# Patient Record
Sex: Female | Born: 1975 | Race: Black or African American | Hispanic: No | Marital: Single | Smoking: Never smoker
Health system: Southern US, Community
[De-identification: ages and names within clinical notes are randomized; demographics above are authoritative.]

## PROBLEM LIST (undated history)

## (undated) DIAGNOSIS — D649 Anemia, unspecified: Secondary | ICD-10-CM

## (undated) HISTORY — PX: INTRAUTERINE DEVICE (IUD) INSERTION: SHX5877

## (undated) HISTORY — DX: Anemia, unspecified: D64.9

---

## 2009-09-06 ENCOUNTER — Ambulatory Visit (HOSPITAL_COMMUNITY): Admission: RE | Admit: 2009-09-06 | Discharge: 2009-09-06 | Payer: Self-pay | Admitting: *Deleted

## 2009-09-28 ENCOUNTER — Ambulatory Visit (HOSPITAL_COMMUNITY): Admission: RE | Admit: 2009-09-28 | Discharge: 2009-09-28 | Payer: Self-pay | Admitting: Family Medicine

## 2009-10-16 ENCOUNTER — Ambulatory Visit (HOSPITAL_COMMUNITY): Admission: RE | Admit: 2009-10-16 | Discharge: 2009-10-16 | Payer: Self-pay | Admitting: Family Medicine

## 2009-12-24 ENCOUNTER — Ambulatory Visit (HOSPITAL_COMMUNITY): Admission: RE | Admit: 2009-12-24 | Discharge: 2009-12-24 | Payer: Self-pay | Admitting: Obstetrics & Gynecology

## 2010-02-11 ENCOUNTER — Ambulatory Visit (HOSPITAL_COMMUNITY): Admission: RE | Admit: 2010-02-11 | Discharge: 2010-02-11 | Payer: Self-pay | Admitting: Family Medicine

## 2010-03-05 ENCOUNTER — Ambulatory Visit (HOSPITAL_COMMUNITY): Admission: RE | Admit: 2010-03-05 | Discharge: 2010-03-05 | Payer: Self-pay | Admitting: Family Medicine

## 2010-03-14 ENCOUNTER — Ambulatory Visit: Payer: Self-pay | Admitting: Obstetrics & Gynecology

## 2010-03-14 ENCOUNTER — Inpatient Hospital Stay (HOSPITAL_COMMUNITY): Admission: AD | Admit: 2010-03-14 | Discharge: 2010-03-19 | Payer: Self-pay | Admitting: Obstetrics & Gynecology

## 2010-03-16 ENCOUNTER — Encounter: Payer: Self-pay | Admitting: Obstetrics & Gynecology

## 2010-03-16 ENCOUNTER — Other Ambulatory Visit: Payer: Self-pay | Admitting: Family Medicine

## 2010-10-13 ENCOUNTER — Encounter: Payer: Self-pay | Admitting: *Deleted

## 2010-12-08 LAB — CBC
Hemoglobin: 11.1 g/dL — ABNORMAL LOW (ref 12.0–15.0)
MCH: 30.2 pg (ref 26.0–34.0)
MCHC: 34.5 g/dL (ref 30.0–36.0)
MCV: 86.5 fL (ref 78.0–100.0)
MCV: 87.2 fL (ref 78.0–100.0)
Platelets: 134 10*3/uL — ABNORMAL LOW (ref 150–400)
Platelets: 222 10*3/uL (ref 150–400)
RBC: 2.85 MIL/uL — ABNORMAL LOW (ref 3.87–5.11)
RDW: 15 % (ref 11.5–15.5)
RDW: 15.2 % (ref 11.5–15.5)
WBC: 12.8 10*3/uL — ABNORMAL HIGH (ref 4.0–10.5)

## 2010-12-08 LAB — CROSSMATCH

## 2010-12-08 LAB — GLUCOSE, CAPILLARY: Glucose-Capillary: 108 mg/dL — ABNORMAL HIGH (ref 70–99)

## 2010-12-08 LAB — RPR: RPR Ser Ql: NONREACTIVE

## 2010-12-08 LAB — ABO/RH: ABO/RH(D): O POS

## 2013-07-28 ENCOUNTER — Other Ambulatory Visit: Payer: Self-pay

## 2013-11-02 ENCOUNTER — Ambulatory Visit (INDEPENDENT_AMBULATORY_CARE_PROVIDER_SITE_OTHER): Payer: No Typology Code available for payment source | Admitting: Nurse Practitioner

## 2013-11-02 ENCOUNTER — Encounter: Payer: Self-pay | Admitting: Nurse Practitioner

## 2013-11-02 VITALS — BP 100/66 | HR 68 | Ht 60.25 in | Wt 166.0 lb

## 2013-11-02 DIAGNOSIS — Z113 Encounter for screening for infections with a predominantly sexual mode of transmission: Secondary | ICD-10-CM

## 2013-11-02 DIAGNOSIS — Z0000 Encounter for general adult medical examination without abnormal findings: Secondary | ICD-10-CM

## 2013-11-02 DIAGNOSIS — Z975 Presence of (intrauterine) contraceptive device: Secondary | ICD-10-CM

## 2013-11-02 DIAGNOSIS — Z01419 Encounter for gynecological examination (general) (routine) without abnormal findings: Secondary | ICD-10-CM

## 2013-11-02 LAB — HEMOGLOBIN, FINGERSTICK: Hemoglobin, fingerstick: 13 g/dL (ref 12.0–16.0)

## 2013-11-02 LAB — POCT URINALYSIS DIPSTICK
Bilirubin, UA: NEGATIVE
Glucose, UA: NEGATIVE
Ketones, UA: NEGATIVE
Leukocytes, UA: NEGATIVE
NITRITE UA: NEGATIVE
PROTEIN UA: NEGATIVE
RBC UA: NEGATIVE
UROBILINOGEN UA: NEGATIVE
pH, UA: 6

## 2013-11-02 NOTE — Progress Notes (Signed)
Patient ID: Sabrina Holmes, female   DOB: 09-18-1976, 38 y.o.   MRN: [id number] 38 y.o. [id number] Single African American Fe here for annual exam.  Menses are regular at 28 days.  Lasting 5 days. Moderate to light.  Condoms all the time..  Needs to restart on some type of birth control.   The time last pill was taken over 10 years. One episode of Depo after daughter was born and never returned for follow up.   Same partner X 1 year. Wants STD checked.  Patient's last menstrual period was 10/16/2013.          Sexually active: yes  The current method of family planning is condoms all of the time.    Exercising: yes  Gym/ health club routine includes cardio. Smoker:  no  Health Maintenance: Pap:  04/2010, no history of abnormal TDaP:  ? Labs: HB:  13.0 Urine:  Negative, pH 6.0   reports that she has never smoked. She has never used smokeless tobacco. She reports that she does not drink alcohol or use illicit drugs.  History reviewed. No pertinent past medical history.  Past Surgical History  Procedure Laterality Date  . Cesarean section  2011    x 1    No current outpatient prescriptions on file.   No current facility-administered medications for this visit.    Family History  Problem Relation Age of Onset  . Hypertension Mother   . Diabetes Mother   . Diabetes Father   . Hypertension Father   . Asthma Brother     ROS:  Pertinent items are noted in HPI.  Otherwise, a comprehensive ROS was negative.  Exam:   BP 100/66  Pulse 68  Ht 5' 0.25" (1.53 m)  Wt 166 lb (75.297 kg)  BMI 32.17 kg/m2  LMP 10/16/2013 Height: 5' 0.25" (153 cm)  Ht Readings from Last 3 Encounters:  11/02/13 5' 0.25" (1.53 m)    General appearance: alert, cooperative and appears stated age Head: Normocephalic, without obvious abnormality, atraumatic Neck: no adenopathy, supple, symmetrical, trachea midline and thyroid normal to inspection and palpation Lungs: clear to auscultation bilaterally Breasts:  normal appearance, no masses or tenderness Heart: regular rate and rhythm Abdomen: soft, non-tender; no masses,  no organomegaly Extremities: extremities normal, atraumatic, no cyanosis or edema Skin: Skin color, texture, turgor normal. No rashes or lesions Lymph nodes: Cervical, supraclavicular, and axillary nodes normal. No abnormal inguinal nodes palpated Neurologic: Grossly normal   Pelvic: External genitalia:  no lesions              Urethra:  normal appearing urethra with no masses, tenderness or lesions              Bartholin's and Skene's: normal                 Vagina: normal appearing vagina with normal color and discharge, no lesions              Cervix: anteverted              Pap taken: yes Bimanual Exam:  Uterus:  normal size, contour, position, consistency, mobility, non-tender              Adnexa: no mass, fullness, tenderness               Rectovaginal: Confirms               Anus:  normal sphincter tone, no lesions  A:  Well Woman  with normal exam  Over 35 with birth control needs - (history of 6 TAB)  Usually non compliant to OCP  R/O STD's    P:   Pap smear as per guidelines Done today  Counseled with regards to birth control choices and options given her non compliance issues.  Suggested IUD,  Implanon or Depo Provera.  She is considering Mirena IUD as the best option.   Info on Mirena IUD and will schedule if insurance covers  Counseled on breast self exam, STD prevention, use and side effects of OCP's, family planning choices, adequate intake of calcium and vitamin D, diet and exercise return annually or prn  An After Visit Summary was printed and given to the patient.

## 2013-11-02 NOTE — Patient Instructions (Signed)

## 2013-11-03 ENCOUNTER — Telehealth: Payer: Self-pay | Admitting: Obstetrics and Gynecology

## 2013-11-03 LAB — STD PANEL
HIV: NONREACTIVE
Hepatitis B Surface Ag: NEGATIVE

## 2013-11-03 LAB — IPS N GONORRHOEA AND CHLAMYDIA BY PCR

## 2013-11-03 NOTE — Telephone Encounter (Signed)
lmtcb

## 2013-11-03 NOTE — Telephone Encounter (Signed)
Patient returned call. I advised of 0 patient liability quoted for iud insertion. Patient to call with cycle

## 2013-11-04 LAB — IPS PAP TEST WITH HPV

## 2013-11-04 NOTE — Progress Notes (Signed)
Encounter reviewed by Dr. Waver Dibiasio Silva.  

## 2013-11-09 ENCOUNTER — Telehealth: Payer: Self-pay | Admitting: Nurse Practitioner

## 2013-11-09 NOTE — Telephone Encounter (Signed)
Thank you. I will be happy to see the patient. I will close the encounter.

## 2013-11-09 NOTE — Telephone Encounter (Signed)
Pt started cycle on yesterday and would like to schedule an appointment for Friday is possible for Mirena insertion.

## 2013-11-09 NOTE — Telephone Encounter (Signed)
Patient started cycle 2/17. Mirena insert requested. Precert completed. Appointment scheduled with Dr. Edward JollySilva for IUD insert for 11/11/13. [id number].  Pre procedure instructions given.  Motrin instructions given. Motrin=Advil=Ibuprofen, 800 mg one hour before appointment. Eat a meal and hydrate well before appointment.  [id number].  Routing to provider for final review. Patient agreeable to disposition. Will close encounter

## 2013-11-11 ENCOUNTER — Ambulatory Visit: Payer: No Typology Code available for payment source | Admitting: Obstetrics and Gynecology

## 2013-11-11 ENCOUNTER — Telehealth: Payer: Self-pay | Admitting: Obstetrics and Gynecology

## 2013-11-11 NOTE — Telephone Encounter (Signed)
Thank you for the update.  Encounter closed. 

## 2013-11-11 NOTE — Telephone Encounter (Signed)
Spoke with patient. She had to cancel today due to car troubles.  Started cycle on 11/08/13.  She is unable to come for an appointment on Monday. She will call for an appointment on the first day of her cycle for next month. Advised to ensure she calls on first day and if starts on a weekend to call on Monday. Patient is agreeable.  Routing to provider for final review. Patient agreeable to disposition. Will close encounter

## 2013-11-11 NOTE — Telephone Encounter (Signed)
Patient had to cancel iud insertion today due to having car problems. Needs to reschedule.

## 2013-12-08 ENCOUNTER — Telehealth: Payer: Self-pay | Admitting: Nurse Practitioner

## 2013-12-08 NOTE — Telephone Encounter (Signed)
Spoke with patient. Started menses 3/19 and would like to schedule IUD placement. Wednesday appointment offered but patient declined requesting a Monday appointment due to schedule. Appointment scheduled for Monday the 3/23 at 1:30 with Dr. Farrel GobbleLathrop. Patient agreeable and states understanding. Pre procedure instructions given.  Motrin instructions given. Motrin=Advil=Ibuprofen, 800 mg one hour before appointment. Eat a meal and hydrate well before appointment. Will call back with any further questions.  Routing to provider for final review. Patient agreeable to disposition. Will close encounter

## 2013-12-08 NOTE — Telephone Encounter (Signed)
Patient calling to schedule IUD insertion. Started cycle today Patient is at lunch til 11:45 if no call before then it will be after 3:30 before she can talk since she is at work.

## 2013-12-12 ENCOUNTER — Encounter: Payer: Self-pay | Admitting: Gynecology

## 2013-12-12 ENCOUNTER — Ambulatory Visit (INDEPENDENT_AMBULATORY_CARE_PROVIDER_SITE_OTHER): Payer: No Typology Code available for payment source | Admitting: Gynecology

## 2013-12-12 VITALS — BP 116/62 | HR 68 | Resp 18 | Ht 60.25 in | Wt 176.0 lb

## 2013-12-12 DIAGNOSIS — Z975 Presence of (intrauterine) contraceptive device: Secondary | ICD-10-CM

## 2013-12-12 NOTE — Patient Instructions (Signed)
IUD PLACEMENT POST PROCEDURE INSTRUCTIONS  1. You may take Ibuprofen, Aleve or Tylenol for pain as needed.      Cramping should resolve within 24 hours.  2. You may have a small amount of spotting. You should wear a mini pad for the next few days  3. You may have intercourse after 24 hours. If you are using this for birth control, it is effective immediately.  4. You need to call if you have any pelvic pain, fever, heavy bleeding or foul smelling vaginal discharge. Irregular bleeding is common the first several months after having an IUD placed. You do not need to for this reason unless you are                     concerned.   5. Shower or bathe as normal  6. You should have a follow-up appointment in 4-8 weeks for a re-check to make sure you are not having any problems.  

## 2013-12-12 NOTE — Progress Notes (Signed)
89 yrsSingle African American female presents for  insertion of Mirena. Denies any vaginal symptoms or STD concerns.    LMP: 12/08/13.  Patient read information regarding IUD insertion.  All questions addressed.    Healthy female,time, place and personnormal menses, no abnormal bleeding, pelvic pain or discharge Abdomen: soft, non-tender Groin inguinal nodes palpated  Pelvic exam: Vulva;normal female genitalia  Vagina:normal vagina, no discharge, exudate, lesion, or erythema  Cervix:Non-tender, Negative CMT, no lesions or redness, abnormal appearance  Uterus:normal shape, position and consistency   Lab:no pathogens   Pt counseled regarding risks and benefits of IUD placement including bleeding profile, infection and uterine perforation.  Pt has had negative STD screen and is monogamous.    Procedure:  Bimanual exam performed.  Speculum inserted into vagina. Cervix visualized and cleansed with betadine solution X 3. Tenaculum placed on cervix at 6 o'clock position(s).  Uterus sounded to 8 centimeters.  IUD removed from sterile packet and under sterile conditions inserted to fundus of uterus.  Introducer removed without difficulty.  IUD string trimmed to 3 centimeters.  Remainder string given to patient to feel for identification.  Tenaculum removed.  Some bleeding noted.  Speculum removed.  Uterus palpated normal.  Patient tolerated procedure well.  A: Insertion of Mirena, Lot # [id number], Expiration date 8/17   P:  Instructions and warnings signs given.       IUD identification card given with IUD removal 11/2018       Return visit 42M

## 2014-01-02 ENCOUNTER — Telehealth: Payer: Self-pay | Admitting: Nurse Practitioner

## 2014-01-02 NOTE — Telephone Encounter (Signed)
Message left to return call to Darrien Laakso at [phone].    

## 2014-01-02 NOTE — Telephone Encounter (Signed)
Pt is having some itching and odor.

## 2014-01-02 NOTE — Telephone Encounter (Addendum)
Spoke with patient. She is having new vaginal itching and odor that patient feels needs to be evaluated by Dr. Farrel GobbleLathrop as she has recently had a mirena iud placed. Denies fevers. Patient requests an office visit for afternoons after 2:30. Okay to use 01/03/14 at 1630?

## 2014-01-03 ENCOUNTER — Ambulatory Visit: Payer: No Typology Code available for payment source | Admitting: Gynecology

## 2014-01-03 NOTE — Telephone Encounter (Signed)
Pt came in for her appointment but did not have her co pay spoke with Shanda BumpsJessica and Kennon RoundsSally and was told to reschedule her. Pt is scheduled for 01/06/14 @ 4:00pm w/DL.

## 2014-01-06 ENCOUNTER — Encounter: Payer: Self-pay | Admitting: Certified Nurse Midwife

## 2014-01-06 ENCOUNTER — Ambulatory Visit (INDEPENDENT_AMBULATORY_CARE_PROVIDER_SITE_OTHER): Payer: No Typology Code available for payment source | Admitting: Certified Nurse Midwife

## 2014-01-06 VITALS — BP 90/60 | HR 72 | Resp 16 | Ht 60.25 in | Wt 177.0 lb

## 2014-01-06 DIAGNOSIS — N760 Acute vaginitis: Secondary | ICD-10-CM

## 2014-01-06 MED ORDER — METRONIDAZOLE 500 MG PO TABS: 500.0000 mg | ORAL_TABLET | Freq: Two times a day (BID) | ORAL | Status: DC

## 2014-01-06 NOTE — Patient Instructions (Signed)

## 2014-01-06 NOTE — Progress Notes (Signed)
38 y.o.Single African American female [phone] with a 2 week(s) history of the following:discharge described as malodorous, dark and old blood color and odor Sexually active: yes Last sexual activity:12days ago. No STD concerns or need for screening. Pt also reports the following associated symptoms: none Patient has not tried over the counter treatment. Denies new personal products or UTI symptoms.  O:Healthy female WDWN Affect:; normal, orientation x 3 Abdomen: soft, non tender    Exam:  HYQ:MVHQIOExt:normal, Bartholin's, Urethra, Skene's normal                NGE:XBMWUXLKGVag:discharge: copious, brown and malodorous, scant blood, pH 5.5, wet prep done, extra flap of vaginal tissue noted in vagina(patient relates has had for years)                Cx:  normal appearance, IUD string visualized and non tender, negative CMT                Uterus:normal size, non-tender, normal shape and consistency                Adnexa: normal adnexa and no mass, fullness, tenderness not present  Wet Prep shows:clue cells   MW:NUUVOZDGUx:bacterial vaginosis  P:Reviewed findings of BV and also normal cervical appearance with IUD noted in cervical os. Questions addressed. Rx Flagyl see order Warning signs with IUD reviewed and need to advise if present. Symptomatic local care discussed. Transport plannerducational materials distributed. Keep follow up visit for IUD with Dr. Farrel GobbleLathrop  RV prn

## 2014-01-13 NOTE — Progress Notes (Signed)
Reviewed personally.  M. Suzanne Othon Guardia, MD.  

## 2014-01-16 ENCOUNTER — Encounter: Payer: Self-pay | Admitting: Gynecology

## 2014-01-16 ENCOUNTER — Ambulatory Visit (INDEPENDENT_AMBULATORY_CARE_PROVIDER_SITE_OTHER): Payer: No Typology Code available for payment source | Admitting: Gynecology

## 2014-01-16 VITALS — BP 100/66 | HR 68 | Ht 60.25 in | Wt 178.0 lb

## 2014-01-16 DIAGNOSIS — Z30431 Encounter for routine checking of intrauterine contraceptive device: Secondary | ICD-10-CM

## 2014-01-16 NOTE — Progress Notes (Signed)
Subjective:     Patient ID: Sabrina Holmes, female   DOB: September 02, 1976, 38 y.o.   MRN: [id number]  HPI Comments: Pt here for 7969m check up after IUD placement.  Pt states that she is bleeding daily, noticed mostly when she wipes.  Pt reports cycle was unchanged.  Pt denies pain with coitus since placement.  Pt was seen recently for vaginitis, feeling better    Review of Systems  Constitutional: Negative for fever, chills and fatigue.  Genitourinary: Positive for vaginal bleeding. Negative for vaginal discharge and pelvic pain.       Objective:   Physical Exam  Nursing note and vitals reviewed. Constitutional: She appears well-developed and well-nourished.   Pelvic: External genitalia:  no lesions              Urethra:  normal appearing urethra with no masses, tenderness or lesions              Bartholins and Skenes: normal                 Vagina: normal appearing vagina with normal color and discharge, no lesions              Cervix: IUD string noted                     Bimanual Exam:  Uterus:  uterus is normal size, shape, consistency and nontender                                      Adnexa: normal adnexa in size, nontender and no masses                                         Assessment:     IUD check up     Plan:     Doing well Bleeding profile reviewed, pt assured.  Bleeding seems to be lightening.

## 2014-01-18 ENCOUNTER — Ambulatory Visit: Payer: No Typology Code available for payment source | Admitting: Gynecology

## 2014-04-03 ENCOUNTER — Telehealth: Payer: Self-pay | Admitting: Nurse Practitioner

## 2014-04-03 DIAGNOSIS — Z309 Encounter for contraceptive management, unspecified: Secondary | ICD-10-CM

## 2014-04-03 NOTE — Telephone Encounter (Signed)
IUD inserted 12-12-13 with Dr Farrel GobbleLathrop. Really bothered by persistent vaginal brown spotting and odor. States odor is almost "foul" and isnt getting worse but wont resolve.Requests appointment for 04-14-14 or later after 230 pm. Advised I would recommend she be seen sooner if complaining of foul odor. States she does not get pain until 04-14-14. States she does not use tampons and denies pain or fever and it is not increasing. OV scheduled for 04-19-14 at 330 (first available late afternoon after 04-14-14. Instructed to call back or go to urgent care if gets worse before appointment and she is agreeable.  Routing to provider for final review. Patient agreeable to disposition. Will close encounter  Sabrina, please precert IUD removal and notify patient.

## 2014-04-03 NOTE — Telephone Encounter (Signed)
Patient is not happy with her IUD and would like to have it removed.

## 2014-04-06 NOTE — Telephone Encounter (Signed)
Left message for patient to call back. Need to notify that IUD removal is covered at 100% of allowable

## 2014-04-19 ENCOUNTER — Encounter: Payer: Self-pay | Admitting: Gynecology

## 2014-04-19 ENCOUNTER — Ambulatory Visit (INDEPENDENT_AMBULATORY_CARE_PROVIDER_SITE_OTHER): Payer: No Typology Code available for payment source | Admitting: Gynecology

## 2014-04-19 VITALS — BP 112/60 | Resp 18 | Ht 60.25 in | Wt 177.0 lb

## 2014-04-19 DIAGNOSIS — Z30432 Encounter for removal of intrauterine contraceptive device: Secondary | ICD-10-CM

## 2014-04-19 DIAGNOSIS — N898 Other specified noninflammatory disorders of vagina: Secondary | ICD-10-CM

## 2014-04-19 LAB — POCT WET PREP (WET MOUNT): Clue Cells Wet Prep Whiff POC: POSITIVE

## 2014-04-19 MED ORDER — TINIDAZOLE 500 MG PO TABS: 2.0000 g | ORAL_TABLET | Freq: Once | ORAL | Status: DC

## 2014-04-19 NOTE — Progress Notes (Signed)
Pt here for IUD removal.  Pt is displeased with vaginal odor.  She was diagnosed with BV in 4/15 shortly after placement but says that the odor returned some time later, worse with menses.  Pt states that she wears a pantiliner every day due to the discharge. She is not involved with anyone seriously, "has a friend".  Is unsure if he has been monogamous.  Pt has had 6 VIP but last one was about 15y ago.  Child is 36yo, thinks she will be ok if she got pregnant again. She prefers to use condoms.  ROS: per HPI BP 112/60  Resp 18  Ht 5' 0.25" (1.53 m)  Wt 177 lb (80.287 kg)  BMI 34.30 kg/m2  LMP 03/19/2014 General appearance: alert, cooperative and appears stated age  Pelvic: External genitalia:  no lesions              Urethra:  normal appearing urethra with no masses, tenderness or lesions              Bartholins and Skenes: normal                 Vagina: scant discharge              Cervix: posterior, IUD strings noted                      Bimanual Exam:  Uterus:  uterus is normal size, shape, consistency and nontender                                      Adnexa: normal adnexa in size, nontender and no masses                                       Wp: no patrhogens or wbc  Assessment: Contraceptive management- pt will use condoms only, see above Strings grasped and IUD removed, shown to pt. WP done before IUD removal-no BV or trich noted. Will treat based on symptoms-tindamax, see order

## 2014-07-24 ENCOUNTER — Encounter: Payer: Self-pay | Admitting: Gynecology

## 2014-11-06 ENCOUNTER — Ambulatory Visit: Payer: No Typology Code available for payment source | Admitting: Nurse Practitioner

## 2014-11-06 ENCOUNTER — Encounter: Payer: Self-pay | Admitting: Nurse Practitioner

## 2014-12-07 ENCOUNTER — Encounter: Payer: Self-pay | Admitting: Nurse Practitioner

## 2014-12-07 ENCOUNTER — Ambulatory Visit (INDEPENDENT_AMBULATORY_CARE_PROVIDER_SITE_OTHER): Payer: 59 | Admitting: Nurse Practitioner

## 2014-12-07 VITALS — BP 100/64 | HR 88 | Ht 60.0 in | Wt 173.0 lb

## 2014-12-07 DIAGNOSIS — Z0000 Encounter for general adult medical examination without abnormal findings: Secondary | ICD-10-CM | POA: Diagnosis not present

## 2014-12-07 DIAGNOSIS — Z113 Encounter for screening for infections with a predominantly sexual mode of transmission: Secondary | ICD-10-CM

## 2014-12-07 DIAGNOSIS — R87618 Other abnormal cytological findings on specimens from cervix uteri: Secondary | ICD-10-CM

## 2014-12-07 DIAGNOSIS — Z01419 Encounter for gynecological examination (general) (routine) without abnormal findings: Secondary | ICD-10-CM | POA: Diagnosis not present

## 2014-12-07 LAB — WET PREP BY MOLECULAR PROBE
Candida species: NEGATIVE
Gardnerella vaginalis: POSITIVE — AB
Trichomonas vaginosis: NEGATIVE

## 2014-12-07 LAB — HEMOGLOBIN, FINGERSTICK: Hemoglobin, fingerstick: 12.2 g/dL (ref 12.0–16.0)

## 2014-12-07 NOTE — Patient Instructions (Signed)

## 2014-12-07 NOTE — Progress Notes (Signed)
Patient ID: Sabrina Holmes, female   DOB: 01-22-1976, 39 y.o.   MRN: [id number] 39 y.o. [id number] Single  African American Fe here for annual exam.  Had Mirena IUD placement on 12/12/13 and removal on 04/19/14 secondary to odor.  After IUD was removed the vaginal went away.  Still occasional vaginal odor.  Condoms now for birth control.  Same partner but not in committed relationship.  Menses now at 5 days and regular.   No cramps.  Patient's last menstrual period was 11/15/2014 (approximate).          Sexually active: Yes.    The current method of family planning is condoms all of the time.    Exercising: Yes.    cardio 3-4 times per week Smoker:  no  Health Maintenance: Pap:  11/02/13, negative with neg HR HPV TDaP:  Within last 10 years Labs:  HB:  12.2  Urine:  Negative    reports that she has never smoked. She has never used smokeless tobacco. She reports that she does not drink alcohol or use illicit drugs.  History reviewed. No pertinent past medical history.  Past Surgical History  Procedure Laterality Date  . Cesarean section  2011    x 1  . Intrauterine device (iud) insertion      inserted 3/15 mirena, removed 04/19/14    No current outpatient prescriptions on file.   No current facility-administered medications for this visit.    Family History  Problem Relation Age of Onset  . Hypertension Mother   . Diabetes Mother   . Diabetes Father   . Hypertension Father   . Asthma Brother     ROS:  Pertinent items are noted in HPI.  Otherwise, a comprehensive ROS was negative.  Exam:   BP 100/64 mmHg  Pulse 88  Ht 5' (1.524 m)  Wt 173 lb (78.472 kg)  BMI 33.79 kg/m2  LMP 11/15/2014 (Approximate) Height: 5' (152.4 cm) Ht Readings from Last 3 Encounters:  12/07/14 5' (1.524 m)  04/19/14 5' 0.25" (1.53 m)  01/16/14 5' 0.25" (1.53 m)    General appearance: alert, cooperative and appears stated age Head: Normocephalic, without obvious abnormality, atraumatic Neck: no  adenopathy, supple, symmetrical, trachea midline and thyroid normal to inspection and palpation Lungs: clear to auscultation bilaterally Breasts: normal appearance, no masses or tenderness Heart: regular rate and rhythm Abdomen: soft, non-tender; no masses,  no organomegaly Extremities: extremities normal, atraumatic, no cyanosis or edema Skin: Skin color, texture, turgor normal. No rashes or lesions Lymph nodes: Cervical, supraclavicular, and axillary nodes normal. No abnormal inguinal nodes palpated Neurologic: Grossly normal   Pelvic: External genitalia:  no lesions              Urethra:  normal appearing urethra with no masses, tenderness or lesions              Bartholin's and Skene's: normal                 Vagina: normal appearing vagina with normal color and discharge, no lesions              Cervix: anteverted              Pap taken: Yes.   Bimanual Exam:  Uterus:  normal size, contour, position, consistency, mobility, non-tender              Adnexa: no mass, fullness, tenderness  Rectovaginal: Confirms               Anus:  normal sphincter tone, no lesions  Chaperone present: yes  A:  Well Woman with normal exam  Condoms for birth control   (history of 6 TAB) Usually non compliant to OCP  Failure with IUD secondary to odor 3/15-7/15 R/O STD's  P:   Reviewed health and wellness pertinent to exam  Pap smear taken today  Will follow with Affirm test and STD's  Counseled on breast self exam, family planning choices, adequate intake of calcium and vitamin D, diet and exercise return annually or prn  An After Visit Summary was printed and given to the patient.

## 2014-12-08 ENCOUNTER — Other Ambulatory Visit: Payer: Self-pay | Admitting: Nurse Practitioner

## 2014-12-08 LAB — STD PANEL
HIV 1&2 Ab, 4th Generation: NONREACTIVE
Hepatitis B Surface Ag: NEGATIVE

## 2014-12-08 MED ORDER — METRONIDAZOLE 0.75 % VA GEL: 1.0000 | Freq: Every day | VAGINAL | Status: DC

## 2014-12-12 LAB — IPS N GONORRHOEA AND CHLAMYDIA BY PCR

## 2014-12-13 LAB — IPS PAP TEST WITH HPV

## 2014-12-14 NOTE — Progress Notes (Signed)
Encounter reviewed by Dr. Brook Silva.  

## 2014-12-18 ENCOUNTER — Telehealth: Payer: Self-pay | Admitting: Nurse Practitioner

## 2014-12-18 NOTE — Telephone Encounter (Signed)
Spoke with patient. Advised patient of message as seen below. 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 Patient is agreeable.  Notes Recorded by Verner Choleborah S Leonard, CNM on 12/13/2014 at 5:04 PM Pap smear reviewed negative HPVHR not detected 02 Notes Recorded by Roanna BanningPatricia R Grubb, FNP on 12/13/2014 at 6:42 AM Results via my chart:  Sabrina Holmes, [facility] Chlamydia test was negative. Notes Recorded by Roanna BanningPatricia R Grubb, FNP on 12/08/2014 at 7:55 AM Results via my chart:  Sabrina CullPriscilla, the Affirm test does show a bacterial infection and the med's will be sent to your pharmacy. The Hep B, HIV, and syphilis is negative. The GC and Chlamydia is not yet reported and we will contact you when that is back.  Sabrina FranklinPatricia Rolen-Grubb, FNP please review and advise pap results.

## 2014-12-18 NOTE — Telephone Encounter (Signed)
Patient is calling to get her results from her last visit. °

## 2014-12-19 NOTE — Telephone Encounter (Signed)
Spoke with patient. Advised patient of message as seen below form Sabrina FranklinPatricia Rolen-Grubb, FNP. Patient is agreeable and verbalizes understanding.   Routing to provider for final review. Patient agreeable to disposition. Will close encounter

## 2014-12-19 NOTE — Telephone Encounter (Signed)
Re reviewed pap smear and is HPVHR detected but 16, 18 is ordered to confirm if colposcopy is needed.  DL reviewed and confirmed with finding.

## 2014-12-19 NOTE — Addendum Note (Signed)
Addended by: Roanna BanningGRUBB, Katlynne Mckercher R on: 12/19/2014 08:36 AM   Modules accepted: Orders

## 2014-12-20 LAB — IPS HPV GENOTYPING 16/18

## 2015-08-06 ENCOUNTER — Telehealth: Payer: Self-pay | Admitting: Nurse Practitioner

## 2015-08-06 NOTE — Telephone Encounter (Signed)
Left message on voicemail to call and reschedule cancelled appointment. °

## 2015-12-10 ENCOUNTER — Ambulatory Visit: Payer: 59 | Admitting: Nurse Practitioner

## 2015-12-19 ENCOUNTER — Ambulatory Visit: Payer: 59 | Admitting: Obstetrics and Gynecology

## 2015-12-19 ENCOUNTER — Telehealth: Payer: Self-pay | Admitting: Obstetrics and Gynecology

## 2015-12-19 NOTE — Telephone Encounter (Signed)
Patient called and cancelled her AEX with Dr. Oscar LaJertson today due to "can't find insurance card." She rescheduled to 01/09/16 and is in the process of ordering a new card.

## 2016-01-09 ENCOUNTER — Ambulatory Visit: Payer: Self-pay | Admitting: Obstetrics and Gynecology

## 2016-01-10 ENCOUNTER — Encounter: Payer: Self-pay | Admitting: Obstetrics and Gynecology

## 2016-01-10 ENCOUNTER — Ambulatory Visit (INDEPENDENT_AMBULATORY_CARE_PROVIDER_SITE_OTHER): Payer: 59 | Admitting: Obstetrics and Gynecology

## 2016-01-10 VITALS — BP 102/70 | HR 80 | Resp 16 | Ht 60.0 in | Wt 179.0 lb

## 2016-01-10 DIAGNOSIS — Z124 Encounter for screening for malignant neoplasm of cervix: Secondary | ICD-10-CM

## 2016-01-10 DIAGNOSIS — Z0000 Encounter for general adult medical examination without abnormal findings: Secondary | ICD-10-CM

## 2016-01-10 DIAGNOSIS — Z01419 Encounter for gynecological examination (general) (routine) without abnormal findings: Secondary | ICD-10-CM

## 2016-01-10 DIAGNOSIS — N898 Other specified noninflammatory disorders of vagina: Secondary | ICD-10-CM

## 2016-01-10 DIAGNOSIS — N9489 Other specified conditions associated with female genital organs and menstrual cycle: Secondary | ICD-10-CM

## 2016-01-10 DIAGNOSIS — Z113 Encounter for screening for infections with a predominantly sexual mode of transmission: Secondary | ICD-10-CM | POA: Diagnosis not present

## 2016-01-10 LAB — COMPREHENSIVE METABOLIC PANEL
ALBUMIN: 4 g/dL (ref 3.6–5.1)
ALT: 17 U/L (ref 6–29)
AST: 15 U/L (ref 10–30)
Alkaline Phosphatase: 53 U/L (ref 33–115)
BILIRUBIN TOTAL: 0.5 mg/dL (ref 0.2–1.2)
BUN: 13 mg/dL (ref 7–25)
CALCIUM: 9 mg/dL (ref 8.6–10.2)
CO2: 27 mmol/L (ref 20–31)
Chloride: 103 mmol/L (ref 98–110)
Creat: 0.65 mg/dL (ref 0.50–1.10)
Glucose, Bld: 82 mg/dL (ref 65–99)
Potassium: 4.1 mmol/L (ref 3.5–5.3)
Sodium: 138 mmol/L (ref 135–146)
Total Protein: 6.6 g/dL (ref 6.1–8.1)

## 2016-01-10 LAB — LIPID PANEL
Cholesterol: 211 mg/dL — ABNORMAL HIGH (ref 125–200)
HDL: 84 mg/dL (ref >=46)
LDL CALC: 118 mg/dL (ref <130)
Total CHOL/HDL Ratio: 2.5 Ratio (ref <=5.0)
Triglycerides: 43 mg/dL (ref <150)
VLDL: 9 mg/dL (ref <30)

## 2016-01-10 LAB — CBC
HCT: 38.9 % (ref 35.0–45.0)
Hemoglobin: 13.1 g/dL (ref 11.7–15.5)
MCH: 27.5 pg (ref 27.0–33.0)
MCHC: 33.7 g/dL (ref 32.0–36.0)
MCV: 81.6 fL (ref 80.0–100.0)
MPV: 8.7 fL (ref 7.5–12.5)
PLATELETS: 315 10*3/uL (ref 140–400)
RBC: 4.77 MIL/uL (ref 3.80–5.10)
RDW: 13.9 % (ref 11.0–15.0)
WBC: 7 10*3/uL (ref 3.8–10.8)

## 2016-01-10 LAB — HEMOGLOBIN A1C
Hgb A1c MFr Bld: 5.5 % (ref <5.7)
Mean Plasma Glucose: 111 mg/dL

## 2016-01-10 LAB — HEPATITIS C ANTIBODY: HCV Ab: NEGATIVE

## 2016-01-10 NOTE — Patient Instructions (Signed)

## 2016-01-10 NOTE — Progress Notes (Signed)
Patient ID: Sabrina Holmes, female   DOB: 1976-06-03, 40 y.o.   MRN: [id number] 40 y.o. [id number] SingleAfrican AmericanF here for annual exam. Patient c/o a vaginal bump, just noticed it recently, not tender, not sure if it's still there. She is also c/o of vaginal odor, intermittent, over 6 months. She is sexually active, same partner, he may have other partners, they always use condoms.     Period Cycle (Days): 28 Period Duration (Days): 4-5 days  Period Pattern: Regular Menstrual Flow: Moderate Menstrual Control: Thin pad, Maxi pad Dysmenorrhea: None  She changes her pad 3 x a day, never saturated.   Patient's last menstrual period was 12/30/2015.          Sexually active: Yes.    The current method of family planning is condoms all the time.    Exercising: Yes.    treadmill Smoker:  no  Health Maintenance: Pap:  12-07-14 WNL Positive HR HPV History of abnormal Pap:  yes MMG:  Never Colonoscopy:  Never BMD:   Never TDaP:  Up to date per patient  Gardasil: N/A   reports that she has never smoked. She has never used smokeless tobacco. She reports that she does not drink alcohol or use illicit drugs. She works as an Human resources officer. She has a 44 year old daughter  History reviewed. No pertinent past medical history.  Past Surgical History  Procedure Laterality Date  . Cesarean section  2011    x 1  . Intrauterine device (iud) insertion      inserted 3/15 mirena, removed 04/19/14    No current outpatient prescriptions on file.   No current facility-administered medications for this visit.    Family History  Problem Relation Age of Onset  . Hypertension Mother   . Diabetes Mother   . Diabetes Father   . Hypertension Father   . Asthma Brother     Review of Systems  Constitutional: Negative.   HENT: Negative.   Eyes: Negative.   Respiratory: Negative.   Cardiovascular: Negative.   Gastrointestinal: Negative.   Endocrine: Negative.   Genitourinary:       Vaginal  odor Vaginal bump   Musculoskeletal: Negative.   Skin: Negative.   Allergic/Immunologic: Negative.   Neurological: Negative.   Psychiatric/Behavioral: Negative.     Exam:   BP 102/70 mmHg  Pulse 80  Resp 16  Ht 5' (1.524 m)  Wt 179 lb (81.194 kg)  BMI 34.96 kg/m2  LMP 12/30/2015  Weight change: @ Height:   Height: 5' (152.4 cm)  Ht Readings from Last 3 Encounters:  01/10/16 5' (1.524 m)  12/07/14 5' (1.524 m)  04/19/14 5' 0.25" (1.53 m)    General appearance: alert, cooperative and appears stated age Head: Normocephalic, without obvious abnormality, atraumatic Neck: no adenopathy, supple, symmetrical, trachea midline and thyroid normal to inspection and palpation Lungs: clear to auscultation bilaterally Breasts: normal appearance, no masses or tenderness Heart: regular rate and rhythm Abdomen: soft, non-tender; bowel sounds normal; no masses,  no organomegaly Extremities: extremities normal, atraumatic, no cyanosis or edema Skin: Skin color, texture, turgor normal. No rashes or lesions Lymph nodes: Cervical, supraclavicular, and axillary nodes normal. No abnormal inguinal nodes palpated Neurologic: Grossly normal   Pelvic: External genitalia:  no lesions              Urethra:  normal appearing urethra with no masses, tenderness or lesions              Bartholins and Skenes:  normal                 Vagina: normal appearing vagina with normal color and discharge, no lesions              Cervix: difficulty with visualizing the cervix, only anterior portion seen.               Bimanual Exam:  Uterus:  normal size, contour, position, consistency, mobility, non-tender              Adnexa: no mass, fullness, tenderness               Rectovaginal: Confirms               Anus:  normal sphincter tone, no lesions  Chaperone was present for exam.  A:  Well Woman with normal exam  H/O +HPV testing last year  Vaginal odor  Needs screening labs   Family history of  DM  P:   Pap with hpv  Full STD testing  Continue to use condoms  Wet prep probe  Discussed breast self exam  Discussed calcium and vit D intake  FLP, hgb A1C, CMP, CBC

## 2016-01-11 LAB — WET PREP BY MOLECULAR PROBE
Candida species: NEGATIVE
Gardnerella vaginalis: POSITIVE — AB
TRICHOMONAS VAG: NEGATIVE

## 2016-01-11 LAB — VITAMIN D 25 HYDROXY (VIT D DEFICIENCY, FRACTURES): VIT D 25 HYDROXY: 21 ng/mL — AB (ref 30–100)

## 2016-01-11 LAB — STD PANEL
HEP B S AG: NEGATIVE
HIV: NONREACTIVE

## 2016-01-14 ENCOUNTER — Telehealth: Payer: Self-pay | Admitting: *Deleted

## 2016-01-14 MED ORDER — METRONIDAZOLE 500 MG PO TABS: 500.0000 mg | ORAL_TABLET | Freq: Two times a day (BID) | ORAL | Status: DC

## 2016-01-14 NOTE — Telephone Encounter (Signed)
I spoke with patient and went over her lab results. Patient voiced understanding- she would like to take the oral Flagyl for her BV. I sent this into her pharmacy. eh

## 2016-01-14 NOTE — Telephone Encounter (Signed)
-----   Message from Romualdo BolkJill Evelyn Jertson, MD sent at 01/14/2016  1:37 PM EDT ----- Please inform the patient that her vaginitis probe was + for BV and treat with flagyl (either oral or vaginal, her choice), no ETOH while on Flagyl.  Oral: Flagyl 500 mg BID x 7 days, or Vaginal: Metrogel, 1 applicator per vagina q day x 5 days. She is deficient in vit D and should start a supplement, 1,000 IU of vit D3 every day (long term) Her cholesterol was minimally elevated, but the rest of the panel was normal. Nothing needs to be done. Her Pap and genprobe are pending. The rest of her lab work was normal.

## 2016-01-15 LAB — IPS N GONORRHOEA AND CHLAMYDIA BY PCR

## 2016-01-15 LAB — IPS PAP TEST WITH HPV

## 2017-01-12 ENCOUNTER — Ambulatory Visit: Payer: 59 | Admitting: Obstetrics and Gynecology

## 2017-01-15 ENCOUNTER — Ambulatory Visit: Payer: 59 | Admitting: Obstetrics and Gynecology

## 2017-01-15 ENCOUNTER — Encounter: Payer: Self-pay | Admitting: Obstetrics and Gynecology

## 2017-01-15 ENCOUNTER — Ambulatory Visit (INDEPENDENT_AMBULATORY_CARE_PROVIDER_SITE_OTHER): Payer: 59 | Admitting: Obstetrics and Gynecology

## 2017-01-15 VITALS — BP 110/60 | HR 72 | Resp 16 | Ht 59.5 in | Wt 181.0 lb

## 2017-01-15 DIAGNOSIS — Z01419 Encounter for gynecological examination (general) (routine) without abnormal findings: Secondary | ICD-10-CM

## 2017-01-15 DIAGNOSIS — N898 Other specified noninflammatory disorders of vagina: Secondary | ICD-10-CM

## 2017-01-15 DIAGNOSIS — E559 Vitamin D deficiency, unspecified: Secondary | ICD-10-CM | POA: Diagnosis not present

## 2017-01-15 DIAGNOSIS — Z8639 Personal history of other endocrine, nutritional and metabolic disease: Secondary | ICD-10-CM | POA: Diagnosis not present

## 2017-01-15 DIAGNOSIS — Z0000 Encounter for general adult medical examination without abnormal findings: Secondary | ICD-10-CM | POA: Diagnosis not present

## 2017-01-15 DIAGNOSIS — Z6835 Body mass index (BMI) 35.0-35.9, adult: Secondary | ICD-10-CM

## 2017-01-15 DIAGNOSIS — Z833 Family history of diabetes mellitus: Secondary | ICD-10-CM

## 2017-01-15 DIAGNOSIS — Z113 Encounter for screening for infections with a predominantly sexual mode of transmission: Secondary | ICD-10-CM

## 2017-01-15 LAB — COMPREHENSIVE METABOLIC PANEL
ALBUMIN: 3.9 g/dL (ref 3.6–5.1)
ALK PHOS: 53 U/L (ref 33–115)
ALT: 17 U/L (ref 6–29)
AST: 19 U/L (ref 10–30)
BILIRUBIN TOTAL: 0.5 mg/dL (ref 0.2–1.2)
BUN: 10 mg/dL (ref 7–25)
CHLORIDE: 105 mmol/L (ref 98–110)
CO2: 24 mmol/L (ref 20–31)
CREATININE: 0.82 mg/dL (ref 0.50–1.10)
Calcium: 9.1 mg/dL (ref 8.6–10.2)
Glucose, Bld: 68 mg/dL (ref 65–99)
Potassium: 4.1 mmol/L (ref 3.5–5.3)
SODIUM: 141 mmol/L (ref 135–146)
TOTAL PROTEIN: 6.4 g/dL (ref 6.1–8.1)

## 2017-01-15 LAB — CBC
HCT: 38.1 % (ref 35.0–45.0)
Hemoglobin: 12.3 g/dL (ref 11.7–15.5)
MCH: 27 pg (ref 27.0–33.0)
MCHC: 32.3 g/dL (ref 32.0–36.0)
MCV: 83.7 fL (ref 80.0–100.0)
MPV: 8.4 fL (ref 7.5–12.5)
PLATELETS: 331 10*3/uL (ref 140–400)
RBC: 4.55 MIL/uL (ref 3.80–5.10)
RDW: 14 % (ref 11.0–15.0)
WBC: 5.8 10*3/uL (ref 3.8–10.8)

## 2017-01-15 LAB — LIPID PANEL
CHOL/HDL RATIO: 2.7 ratio (ref <5.0)
CHOLESTEROL: 182 mg/dL (ref <200)
HDL: 67 mg/dL (ref >50)
LDL CALC: 106 mg/dL — AB (ref <100)
Triglycerides: 43 mg/dL (ref <150)
VLDL: 9 mg/dL (ref <30)

## 2017-01-15 NOTE — Patient Instructions (Signed)

## 2017-01-15 NOTE — Progress Notes (Signed)
41 y.o. [id number] SingleAfrican AmericanF here for annual exam.   She c/o a slight vaginal odor for the last few days. No abnormal vaginal d/c. No itching, burning or irritation. Sexually active, same partner, uses condoms (he may have other partners) Period Cycle (Days): 28 Period Duration (Days): 5 days  Period Pattern: Regular Menstrual Flow: Moderate Menstrual Control: Maxi pad Menstrual Control Change Freq (Hours): changes pad 3 times a day  Dysmenorrhea: None  Patient's last menstrual period was 01/11/2017.          Sexually active: Yes.    The current method of family planning is condoms every time.    Exercising: No.  The patient does not participate in regular exercise at present. Smoker:  no  Health Maintenance: Pap:  01-10-16 WNL NEG HR HPV 12-07-14 WNL +HR HPV History of abnormal Pap:  Yes + HR HPV 2016 MMG:  Never Colonoscopy:  Never BMD:   Never TDaP:  Unsure, states UTD  Gardasil: N/A   reports that she has never smoked. She has never used smokeless tobacco. She reports that she does not drink alcohol or use drugs. 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  Past Surgical History:  Procedure Laterality Date  . CESAREAN SECTION  2011   x 1  . INTRAUTERINE DEVICE (IUD) INSERTION     inserted 3/15 mirena, removed 04/19/14    No current outpatient prescriptions on file.   No current facility-administered medications for this visit.     Family History  Problem Relation Age of Onset  . Hypertension Mother   . Diabetes Mother   . Diabetes Father   . Hypertension Father   . Asthma Brother     Review of Systems  Constitutional: Negative.   HENT: Negative.   Eyes: Negative.   Respiratory: Negative.   Cardiovascular: Negative.   Gastrointestinal: Negative.   Endocrine: Negative.   Genitourinary: Negative.   Musculoskeletal: Negative.   Skin: Negative.   Allergic/Immunologic: Negative.   Neurological:  Negative.   Psychiatric/Behavioral: Negative.     Exam:   BP 110/60 (BP Location: Right Arm, Patient Position: Sitting, Cuff Size: Normal)   Pulse 72   Resp 16   Ht 4' 11.5" (1.511 m)   Wt 181 lb (82.1 kg)   LMP 01/11/2017   BMI 35.95 kg/m   Weight change: @ Height:   Height: 4' 11.5" (151.1 cm)  Ht Readings from Last 3 Encounters:  01/15/17 4' 11.5" (1.511 m)  01/10/16 5' (1.524 m)  12/07/14 5' (1.524 m)    General appearance: alert, cooperative and appears stated age Head: Normocephalic, without obvious abnormality, atraumatic Neck: no adenopathy, supple, symmetrical, trachea midline and thyroid normal to inspection and palpation Lungs: clear to auscultation bilaterally Cardiovascular: regular rate and rhythm Breasts: normal appearance, no masses or tenderness Abdomen: soft, non-tender; bowel sounds normal; no masses,  no organomegaly Extremities: extremities normal, atraumatic, no cyanosis or edema Skin: Skin color, texture, turgor normal. No rashes or lesions Lymph nodes: Cervical, supraclavicular, and axillary nodes normal. No abnormal inguinal nodes palpated Neurologic: Grossly normal   Pelvic: External genitalia:  no lesions              Urethra:  normal appearing urethra with no masses, tenderness or lesions              Bartholins and Skenes: normal  Vagina: normal appearing vagina with normal color and discharge, no lesions              Cervix: no lesions               Bimanual Exam:  Uterus:  normal size, contour, position, consistency, mobility, non-tender              Adnexa: no mass, fullness, tenderness               Rectovaginal: Confirms               Anus:  normal sphincter tone, no lesions  Chaperone was present for exam.  A:  Well Woman with normal exam  Vaginal odor  Family history of diabetes  BMI 35  H/O vit d def  P:   No pap this year  STD testing  Screening labs  Discussed breast self exam  Discussed calcium  and vit D intake  Weight loss discussed

## 2017-01-16 ENCOUNTER — Other Ambulatory Visit: Payer: Self-pay | Admitting: *Deleted

## 2017-01-16 LAB — STD PANEL
HEP B S AG: NEGATIVE
HIV: NONREACTIVE

## 2017-01-16 LAB — HEMOGLOBIN A1C
HEMOGLOBIN A1C: 5 % (ref <5.7)
Mean Plasma Glucose: 97 mg/dL

## 2017-01-16 LAB — VITAMIN D 25 HYDROXY (VIT D DEFICIENCY, FRACTURES): Vit D, 25-Hydroxy: 27 ng/mL — ABNORMAL LOW (ref 30–100)

## 2017-01-16 LAB — WET PREP BY MOLECULAR PROBE
Candida species: DETECTED — AB
GARDNERELLA VAGINALIS: DETECTED — AB
Trichomonas vaginosis: NOT DETECTED

## 2017-01-16 LAB — GC/CHLAMYDIA PROBE AMP
CT PROBE, AMP APTIMA: NOT DETECTED
GC Probe RNA: NOT DETECTED

## 2017-01-16 LAB — HEPATITIS C ANTIBODY: HCV Ab: NEGATIVE

## 2017-01-16 MED ORDER — FLUCONAZOLE 150 MG PO TABS: ORAL_TABLET | ORAL | 0 refills | Status: DC

## 2017-01-16 MED ORDER — METRONIDAZOLE 500 MG PO TABS: 500.0000 mg | ORAL_TABLET | Freq: Two times a day (BID) | ORAL | 0 refills | Status: DC

## 2017-12-28 ENCOUNTER — Telehealth: Payer: Self-pay | Admitting: Obstetrics and Gynecology

## 2017-12-28 NOTE — Telephone Encounter (Signed)
Left patient a message to call back to reschedule a future appointment that was cancelled by the provider for an AEX with Dr. Oscar LaJertson.

## 2018-01-18 ENCOUNTER — Ambulatory Visit: Payer: 59 | Admitting: Obstetrics and Gynecology

## 2018-02-24 NOTE — Progress Notes (Deleted)
42 y.o. [id number] SingleAfrican AmericanF here for annual exam.      No LMP recorded.          Sexually active: {yes no:314532}  The current method of family planning is {contraception:315051}.    Exercising: {yes no:314532}  {types:19826} Smoker:  no  Health Maintenance: Pap: 01-10-16 Neg:Neg HR HPV, 12-07-14 Neg:Pos HR HPV History of abnormal Pap:  Yes, 2016 Pos.HR HPV MMG:  *** Colonoscopy:  n/a BMD:   n/a TDaP:  *** Gardasil: n/a   reports that she has never smoked. She has never used smokeless tobacco. She reports that she does not drink alcohol or use drugs.  No past medical history on file.  Past Surgical History:  Procedure Laterality Date  . CESAREAN SECTION  2011   x 1  . INTRAUTERINE DEVICE (IUD) INSERTION     inserted 3/15 mirena, removed 04/19/14    Current Outpatient Medications  Medication Sig Dispense Refill  . fluconazole (DIFLUCAN) 150 MG tablet Take one tablet now, may repeat again in 72 hours is symptoms still present. 2 tablet 0  . metroNIDAZOLE (FLAGYL) 500 MG tablet Take 1 tablet (500 mg total) by mouth 2 (two) times daily. 14 tablet 0   No current facility-administered medications for this visit.     Family History  Problem Relation Age of Onset  . Hypertension Mother   . Diabetes Mother   . Diabetes Father   . Hypertension Father   . Asthma Brother     Review of Systems  Exam:   There were no vitals taken for this visit.  Weight change: @WEIGHTCHANGE @ Height:      Ht Readings from Last 3 Encounters:  01/15/17 4' 11.5" (1.511 m)  01/10/16 5' (1.524 m)  12/07/14 5' (1.524 m)    General appearance: alert, cooperative and appears stated age Head: Normocephalic, without obvious abnormality, atraumatic Neck: no adenopathy, supple, symmetrical, trachea midline and thyroid {CHL AMB PHY EX THYROID NORM [phone]::"normal to inspection and palpation"} Lungs: clear to auscultation bilaterally Cardiovascular: regular rate and  rhythm Breasts: {Exam; breast:13139::"normal appearance, no masses or tenderness"} Abdomen: soft, non-tender; non distended,  no masses,  no organomegaly Extremities: extremities normal, atraumatic, no cyanosis or edema Skin: Skin color, texture, turgor normal. No rashes or lesions Lymph nodes: Cervical, supraclavicular, and axillary nodes normal. No abnormal inguinal nodes palpated Neurologic: Grossly normal   Pelvic: External genitalia:  no lesions              Urethra:  normal appearing urethra with no masses, tenderness or lesions              Bartholins and Skenes: normal                 Vagina: normal appearing vagina with normal color and discharge, no lesions              Cervix: {CHL AMB PHY EX CERVIX NORM DEFAULT:[phone]::"no lesions"}               Bimanual Exam:  Uterus:  {CHL AMB PHY EX UTERUS NORM DEFAULT:[phone]::"normal size, contour, position, consistency, mobility, non-tender"}              Adnexa: {CHL AMB PHY EX ADNEXA NO MASS DEFAULT:[phone]::"no mass, fullness, tenderness"}               Rectovaginal: Confirms               Anus:  normal sphincter tone, no lesions  Chaperone was  present for exam.  A:  Well Woman with normal exam  P:

## 2018-02-26 ENCOUNTER — Ambulatory Visit: Payer: 59 | Admitting: Obstetrics and Gynecology

## 2018-03-22 ENCOUNTER — Other Ambulatory Visit: Payer: Self-pay | Admitting: Nurse Practitioner

## 2018-03-22 DIAGNOSIS — Z1231 Encounter for screening mammogram for malignant neoplasm of breast: Secondary | ICD-10-CM

## 2018-05-03 NOTE — Progress Notes (Signed)
42 y.o. [id number] Single African American Female here for annual exam.   Period Cycle (Days): 28 Period Duration (Days): 5 days Period Pattern: Regular Menstrual Flow: Moderate Menstrual Control: Maxi pad, Thin pad Menstrual Control Change Freq (Hours): changes every pad every 4-5 hours Dysmenorrhea: None  Not sexually active currently. Desires STD testing.  Patient's last menstrual period was 04/24/2018 (exact date).          Sexually active: No.  The current method of family planning is none.    Exercising: No.  The patient does not participate in regular exercise at present. Smoker:  no  Health Maintenance: Pap:  01-10-16 WNL NEG HR HPV 12-07-14 WNL +HR HPV History of abnormal Pap:  Yes + HR HPV 2016 MMG:  Never Colonoscopy:  Never BMD:   Never TDaP:  Unsure Gardasil: Unsure, discussed, information given.    reports that she has never smoked. She has never used smokeless tobacco. She reports that she does not drink alcohol or use drugs. 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 No pertinent past medical history.  Past Surgical History:  Procedure Laterality Date  . CESAREAN SECTION  2011   x 1  . INTRAUTERINE DEVICE (IUD) INSERTION     inserted 3/15 mirena, removed 04/19/14    No current outpatient medications on file.   No current facility-administered medications for this visit.     Family History  Problem Relation Age of Onset  . Hypertension Mother   . Diabetes Mother   . Diabetes Father   . Hypertension Father   . Asthma Brother     Review of Systems  Constitutional: Negative.   HENT: Negative.   Eyes: Negative.   Respiratory: Negative.   Cardiovascular: Positive for leg swelling.       Left foot numbness  Gastrointestinal: Negative.   Endocrine: Negative.   Genitourinary: Negative.   Musculoskeletal: Negative.   Skin: Negative.   Allergic/Immunologic: Negative.   Neurological: Negative.   Hematological:  Negative.   Psychiatric/Behavioral: Negative.     Exam:   BP 114/72 (BP Location: Right Arm, Patient Position: Sitting, Cuff Size: Normal)   Pulse 72   Ht 4' 11.45" (1.51 m)   Wt 187 lb 3.2 oz (84.9 kg)   LMP 04/24/2018 (Exact Date)   BMI 37.24 kg/m   Weight change: @WEIGHTCHANGE @ Height:   Height: 4' 11.45" (151 cm)  Ht Readings from Last 3 Encounters:  05/05/18 4' 11.45" (1.51 m)  01/15/17 4' 11.5" (1.511 m)  01/10/16 5' (1.524 m)    General appearance: alert, cooperative and appears stated age Head: Normocephalic, without obvious abnormality, atraumatic Neck: no adenopathy, supple, symmetrical, trachea midline and thyroid normal to inspection and palpation Lungs: clear to auscultation bilaterally Cardiovascular: regular rate and rhythm Breasts: normal appearance, no masses or tenderness Abdomen: soft, non-tender; non distended,  no masses,  no organomegaly Extremities: extremities normal, atraumatic, no cyanosis or edema Skin: Skin color, texture, turgor normal. No rashes or lesions Lymph nodes: Cervical, supraclavicular, and axillary nodes normal. No abnormal inguinal nodes palpated Neurologic: Grossly normal   Pelvic: External genitalia:  no lesions              Urethra:  normal appearing urethra with no masses, tenderness or lesions              Bartholins and Skenes: normal                 Vagina:  normal appearing vagina with a slight increase in watery, white vaginal discharge, no lesions              Cervix: no lesions               Bimanual Exam:  Uterus:  normal size, contour, position, consistency, mobility, non-tender              Adnexa: no mass, fullness, tenderness               Rectovaginal: Confirms               Anus:  normal sphincter tone, no lesions  Chaperone was present for exam.  A:  Well Woman with normal exam  FH DM  Occasional vaginal odor, no d/c  Vit d def  Leg swelling, numbness, # of primary MD given  P:   Pap with hpv(patient  desires) , GC/CT, BV  Mammogram  Discussed breast self exam  Discussed calcium and vit D intake  Screening labs  STD testing  Vit D  HgbA1C  Discussed gardasil, information given  TDAP given in Right deltoid. Patient tolerated injection well

## 2018-05-05 ENCOUNTER — Encounter: Payer: Self-pay | Admitting: Obstetrics and Gynecology

## 2018-05-05 ENCOUNTER — Other Ambulatory Visit: Payer: Self-pay

## 2018-05-05 ENCOUNTER — Other Ambulatory Visit (HOSPITAL_COMMUNITY)
Admission: RE | Admit: 2018-05-05 | Discharge: 2018-05-05 | Disposition: A | Discharge status: Home / Self Care | Payer: 59 | Source: Ambulatory Visit | Attending: Obstetrics and Gynecology | Admitting: Obstetrics and Gynecology

## 2018-05-05 ENCOUNTER — Ambulatory Visit (INDEPENDENT_AMBULATORY_CARE_PROVIDER_SITE_OTHER): Payer: 59 | Admitting: Obstetrics and Gynecology

## 2018-05-05 VITALS — BP 114/72 | HR 72 | Ht 59.45 in | Wt 187.2 lb

## 2018-05-05 DIAGNOSIS — Z124 Encounter for screening for malignant neoplasm of cervix: Secondary | ICD-10-CM | POA: Diagnosis present

## 2018-05-05 DIAGNOSIS — Z0000 Encounter for general adult medical examination without abnormal findings: Secondary | ICD-10-CM | POA: Diagnosis not present

## 2018-05-05 DIAGNOSIS — Z23 Encounter for immunization: Secondary | ICD-10-CM | POA: Diagnosis not present

## 2018-05-05 DIAGNOSIS — Z01419 Encounter for gynecological examination (general) (routine) without abnormal findings: Secondary | ICD-10-CM

## 2018-05-05 DIAGNOSIS — N898 Other specified noninflammatory disorders of vagina: Secondary | ICD-10-CM | POA: Insufficient documentation

## 2018-05-05 DIAGNOSIS — E559 Vitamin D deficiency, unspecified: Secondary | ICD-10-CM

## 2018-05-05 DIAGNOSIS — Z833 Family history of diabetes mellitus: Secondary | ICD-10-CM

## 2018-05-05 DIAGNOSIS — Z113 Encounter for screening for infections with a predominantly sexual mode of transmission: Secondary | ICD-10-CM | POA: Insufficient documentation

## 2018-05-05 NOTE — Patient Instructions (Signed)
EXERCISE AND DIET:  We recommended that you start or continue a regular exercise program for good health. Regular exercise means any activity that makes your heart beat faster and makes you sweat.  We recommend exercising at least 30 minutes per day at least 3 days a week, preferably 4 or 5.  We also recommend a diet low in fat and sugar.  Inactivity, poor dietary choices and obesity can cause diabetes, heart attack, stroke, and kidney damage, among others.    ALCOHOL AND SMOKING:  Women should limit their alcohol intake to no more than 7 drinks/beers/glasses of wine (combined, not each!) per week. Moderation of alcohol intake to this level decreases your risk of breast cancer and liver damage. And of course, no recreational drugs are part of a healthy lifestyle.  And absolutely no smoking or even second hand smoke. Most people know smoking can cause heart and lung diseases, but did you know it also contributes to weakening of your bones? Aging of your skin?  Yellowing of your teeth and nails?  CALCIUM AND VITAMIN D:  Adequate intake of calcium and Vitamin D are recommended.  The recommendations for exact amounts of these supplements seem to change often, but generally speaking 1000 mg of calcium (either carbonate or citrate) and 800 units of Vitamin D per day seems prudent. Certain women may benefit from higher intake of Vitamin D.  If you are among these women, your doctor will have told you during your visit.    PAP SMEARS:  Pap smears, to check for cervical cancer or precancers,  have traditionally been done yearly, although recent scientific advances have shown that most women can have pap smears less often.  However, every woman still should have a physical exam from her gynecologist every year. It will include a breast check, inspection of the vulva and vagina to check for abnormal growths or skin changes, a visual exam of the cervix, and then an exam to evaluate the size and shape of the uterus and  ovaries.  And after 40 years of age, a rectal exam is indicated to check for rectal cancers. We will also provide age appropriate advice regarding health maintenance, like when you should have certain vaccines, screening for sexually transmitted diseases, bone density testing, colonoscopy, mammograms, etc.   MAMMOGRAMS:  All women over 40 years old should have a yearly mammogram. Many facilities now offer a "3D" mammogram, which may cost around $50 extra out of pocket. If possible,  we recommend you accept the option to have the 3D mammogram performed.  It both reduces the number of women who will be called back for extra views which then turn out to be normal, and it is better than the routine mammogram at detecting truly abnormal areas.    COLONOSCOPY:  Colonoscopy to screen for colon cancer is recommended for all women at age 30.  We know, you hate the idea of the prep.  We agree, BUT, having colon cancer and not knowing it is worse!!  Colon cancer so often starts as a polyp that can be seen and removed at colonscopy, which can quite literally save your life!  And if your first colonoscopy is normal and you have no family history of colon cancer, most women don't have to have it again for 10 years.  Once every ten years, you can do something that may end up saving your life, right?  We will be happy to help you get it scheduled when you are ready.  Be sure to check your insurance coverage so you understand how much it will cost.  It may be covered as a preventative service at no cost, but you should check your particular policy.      Breast Self-Awareness Breast self-awareness means being familiar with how your breasts look and feel. It involves checking your breasts regularly and reporting any changes to your health care provider. Practicing breast self-awareness is important. A change in your breasts can be a sign of a serious medical problem. Being familiar with how your breasts look and feel allows  you to find any problems early, when treatment is more likely to be successful. All women should practice breast self-awareness, including women who have had breast implants. How to do a breast self-exam One way to learn what is normal for your breasts and whether your breasts are changing is to do a breast self-exam. To do a breast self-exam: Look for Changes  1. Remove all the clothing above your waist. 2. Stand in front of a mirror in a room with good lighting. 3. Put your hands on your hips. 4. Push your hands firmly downward. 5. Compare your breasts in the mirror. Look for differences between them (asymmetry), such as: ? Differences in shape. ? Differences in size. ? Puckers, dips, and bumps in one breast and not the other. 6. Look at each breast for changes in your skin, such as: ? Redness. ? Scaly areas. 7. Look for changes in your nipples, such as: ? Discharge. ? Bleeding. ? Dimpling. ? Redness. ? A change in position. Feel for Changes  Carefully feel your breasts for lumps and changes. It is best to do this while lying on your back on the floor and again while sitting or standing in the shower or tub with soapy water on your skin. Feel each breast in the following way:  Place the arm on the side of the breast you are examining above your head.  Feel your breast with the other hand.  Start in the nipple area and make  inch (2 cm) overlapping circles to feel your breast. Use the pads of your three middle fingers to do this. Apply light pressure, then medium pressure, then firm pressure. The light pressure will allow you to feel the tissue closest to the skin. The medium pressure will allow you to feel the tissue that is a little deeper. The firm pressure will allow you to feel the tissue close to the ribs.  Continue the overlapping circles, moving downward over the breast until you feel your ribs below your breast.  Move one finger-width toward the center of the body.  Continue to use the  inch (2 cm) overlapping circles to feel your breast as you move slowly up toward your collarbone.  Continue the up and down exam using all three pressures until you reach your armpit.  Write Down What You Find  Write down what is normal for each breast and any changes that you find. Keep a written record with breast changes or normal findings for each breast. By writing this information down, you do not need to depend only on memory for size, tenderness, or location. Write down where you are in your menstrual cycle, if you are still menstruating. If you are having trouble noticing differences in your breasts, do not get discouraged. With time you will become more familiar with the variations in your breasts and more comfortable with the exam. How often should I examine my breasts? Examine   your breasts every month. If you are breastfeeding, the best time to examine your breasts is after a feeding or after using a breast pump. If you menstruate, the best time to examine your breasts is 5-7 days after your period is over. During your period, your breasts are lumpier, and it may be more difficult to notice changes. When should I see my health care provider? See your health care provider if you notice:  A change in shape or size of your breasts or nipples.  A change in the skin of your breast or nipples, such as a reddened or scaly area.  Unusual discharge from your nipples.  A lump or thick area that was not there before.  Pain in your breasts.  Anything that concerns you.  This information is not intended to replace advice given to you by your health care provider. Make sure you discuss any questions you have with your health care provider. Document Released: 09/08/2005 Document Revised: 02/14/2016 Document Reviewed: 07/29/2015 [employer] Interactive Patient Education  2018 [employer]  

## 2018-05-06 LAB — CBC
Hematocrit: 36.5 % (ref 34.0–46.6)
Hemoglobin: 11.9 g/dL (ref 11.1–15.9)
MCH: 27.3 pg (ref 26.6–33.0)
MCHC: 32.6 g/dL (ref 31.5–35.7)
MCV: 84 fL (ref 79–97)
Platelets: 341 10*3/uL (ref 150–450)
RBC: 4.36 x10E6/uL (ref 3.77–5.28)
RDW: 14.2 % (ref 12.3–15.4)
WBC: 5.6 10*3/uL (ref 3.4–10.8)

## 2018-05-06 LAB — VITAMIN D 25 HYDROXY (VIT D DEFICIENCY, FRACTURES): Vit D, 25-Hydroxy: 22.4 ng/mL — ABNORMAL LOW (ref 30.0–100.0)

## 2018-05-06 LAB — HEP, RPR, HIV PANEL
HIV SCREEN 4TH GENERATION: NONREACTIVE
Hepatitis B Surface Ag: NEGATIVE
RPR: NONREACTIVE

## 2018-05-06 LAB — COMPREHENSIVE METABOLIC PANEL
A/G RATIO: 1.6 (ref 1.2–2.2)
ALK PHOS: 68 IU/L (ref 39–117)
ALT: 13 IU/L (ref 0–32)
AST: 18 IU/L (ref 0–40)
Albumin: 4.1 g/dL (ref 3.5–5.5)
BILIRUBIN TOTAL: 0.3 mg/dL (ref 0.0–1.2)
BUN/Creatinine Ratio: 11 (ref 9–23)
BUN: 8 mg/dL (ref 6–24)
CALCIUM: 9.3 mg/dL (ref 8.7–10.2)
CHLORIDE: 105 mmol/L (ref 96–106)
CO2: 23 mmol/L (ref 20–29)
Creatinine, Ser: 0.7 mg/dL (ref 0.57–1.00)
GFR calc Af Amer: 125 mL/min/{1.73_m2} (ref >59)
GFR calc non Af Amer: 109 mL/min/{1.73_m2} (ref >59)
Globulin, Total: 2.6 g/dL (ref 1.5–4.5)
Glucose: 80 mg/dL (ref 65–99)
POTASSIUM: 4.1 mmol/L (ref 3.5–5.2)
Sodium: 140 mmol/L (ref 134–144)
Total Protein: 6.7 g/dL (ref 6.0–8.5)

## 2018-05-06 LAB — LIPID PANEL
CHOLESTEROL TOTAL: 200 mg/dL — AB (ref 100–199)
Chol/HDL Ratio: 3 ratio (ref 0.0–4.4)
HDL: 67 mg/dL (ref >39)
LDL Calculated: 122 mg/dL — ABNORMAL HIGH (ref 0–99)
TRIGLYCERIDES: 57 mg/dL (ref 0–149)
VLDL Cholesterol Cal: 11 mg/dL (ref 5–40)

## 2018-05-06 LAB — HEMOGLOBIN A1C
ESTIMATED AVERAGE GLUCOSE: 108 mg/dL
HEMOGLOBIN A1C: 5.4 % (ref 4.8–5.6)

## 2018-05-07 LAB — CYTOLOGY - PAP
Bacterial vaginitis: POSITIVE — AB
Chlamydia: NEGATIVE
Diagnosis: NEGATIVE
HPV (WINDOPATH): NOT DETECTED
NEISSERIA GONORRHEA: NEGATIVE
TRICH (WINDOWPATH): NEGATIVE

## 2018-05-10 ENCOUNTER — Telehealth: Payer: Self-pay

## 2018-05-10 ENCOUNTER — Ambulatory Visit: Payer: Self-pay

## 2018-05-10 MED ORDER — METRONIDAZOLE 500 MG PO TABS: 500.0000 mg | ORAL_TABLET | Freq: Two times a day (BID) | ORAL | 0 refills | Status: DC

## 2018-05-10 MED ORDER — FLUCONAZOLE 150 MG PO TABS: 150.0000 mg | ORAL_TABLET | Freq: Once | ORAL | 0 refills | Status: AC

## 2018-05-10 NOTE — Telephone Encounter (Signed)
-----   Message from Romualdo BolkJill Evelyn Jertson, MD sent at 05/07/2018  5:26 PM EDT ----- 02 recall Please inform the patient that her pap was + for BV and yeast. Treat with diflucan and  flagyl (either oral or vaginal, her choice), no ETOH while on Flagyl.  Oral: Flagyl 500 mg BID x 7 days, or Vaginal: Metrogel, 1 applicator per vagina q day x 5 days. Diflucan 150 mg po x 1, repeat x 1 in 72 hours if symptomatic Please inform of normal pap, negative GC/CT and trich, negative hpv

## 2018-05-10 NOTE — Telephone Encounter (Signed)
Spoke with patient. Results given. Patient verbalizes understanding. Rx for Diflucan 150 mg po x 1 repeat in 72 hours if symptoms persist #2 0RF and Flagyl 500 mg po BID x 7 days #14 0RF sent to pharmacy on file. Avoid alcohol during treatment and 24 hours after completing medication. Don't mix with alcohol if mixed can cause severe nausea, vomiting and abdominal cramping. Patient verbalizes understanding. 02 recall entered. Will close encounter.

## 2018-06-01 ENCOUNTER — Ambulatory Visit
Admission: RE | Admit: 2018-06-01 | Discharge: 2018-06-01 | Disposition: A | Discharge status: Home / Self Care | Payer: 59 | Source: Ambulatory Visit | Attending: Nurse Practitioner | Admitting: Nurse Practitioner

## 2018-06-01 DIAGNOSIS — Z1231 Encounter for screening mammogram for malignant neoplasm of breast: Secondary | ICD-10-CM

## 2018-07-05 NOTE — Progress Notes (Signed)
Sabrina Holmes is a 42 y.o. female is here to [facility].   Patient Care Team: Helane Rima, DO as PCP - General (Family Medicine) Romualdo Bolk, MD as Consulting Physician (Obstetrics and Gynecology)   History of Present Illness:   Ankle Pain   There was no injury mechanism. The pain is present in the left ankle. The quality of the pain is described as aching. The pain is mild. The pain has been intermittent since onset. Associated symptoms include a loss of motion. The symptoms are aggravated by weight bearing. She has tried NSAIDs for the symptoms. The treatment provided mild relief.   There are no preventive care reminders to display for this patient.   Depression screen PHQ 2/9 07/06/2018  Decreased Interest 0  Down, Depressed, Hopeless 1  PHQ - 2 Score 1   PMHx, SurgHx, SocialHx, Medications, and Allergies were reviewed in the Visit Navigator and updated as appropriate.   History reviewed. No pertinent past medical history.   Past Surgical History:  Procedure Laterality Date  . CESAREAN SECTION  2011   x 1  . INTRAUTERINE DEVICE (IUD) INSERTION     inserted 3/15 mirena, removed 04/19/14     Family History  Problem Relation Age of Onset  . Hypertension Mother   . Diabetes Mother   . Diabetes Father   . Hypertension Father   . Asthma Brother   . Breast cancer Paternal Aunt   . Breast cancer Maternal Grandmother   . Breast cancer Paternal Grandmother     Social History   Tobacco Use  . Smoking status: Never Smoker  . Smokeless tobacco: Never Used  Substance Use Topics  . Alcohol use: No  . Drug use: No    Current Medications and Allergies:  No current outpatient medications on file.   Allergies  Allergen Reactions  . Penicillins    Review of Systems:   Pertinent items are noted in the HPI. Otherwise, ROS is negative.  Vitals:   Vitals:   07/06/18 1512  BP: 116/74  Pulse: 93  Temp: 98.2 F (36.8 C)  TempSrc: Oral  SpO2:  98%  Weight: 186 lb (84.4 kg)  Height: 4' 11.45" (1.51 m)     Body mass index is 37 kg/m.  Physical Exam:   Physical Exam  Constitutional: She appears well-nourished.  HENT:  Head: Normocephalic and atraumatic.  Eyes: Pupils are equal, round, and reactive to light. EOM are normal.  Neck: Normal range of motion. Neck supple.  Cardiovascular: Normal rate, regular rhythm, normal heart sounds and intact distal pulses.  Pulmonary/Chest: Effort normal.  Abdominal: Soft.  Musculoskeletal:       Left ankle: She exhibits swelling. Tenderness. Lateral malleolus tenderness found.  Skin: Skin is warm.  Psychiatric: She has a normal mood and affect. Her behavior is normal.  Nursing note and vitals reviewed.  Xray: ankle with mild OA.  Assessment and Plan:   Gerry was seen today for establish care.  Diagnoses and all orders for this visit:  Chronic pain of left ankle Comments: Mechanical, with valgus deformity. Offered SM v [employer] for orthotics. May use ankle sleeve while working.  Orders: -     DG Ankle Complete Left  Acquired bilateral valgus deformity of ankles    . Reviewed expectations re: course of current medical issues. . Discussed self-management of symptoms. . Outlined signs and symptoms indicating need for more acute intervention. . Patient verbalized understanding and all questions were answered. Marland Kitchen Health Maintenance  issues including appropriate healthy diet, exercise, and smoking avoidance were discussed with patient. . See orders for this visit as documented in the electronic medical record. . Patient received an After Visit Summary.  Helane Rima, DO [place] 07/08/2018

## 2018-07-06 ENCOUNTER — Encounter: Payer: Self-pay | Admitting: Family Medicine

## 2018-07-06 ENCOUNTER — Ambulatory Visit (INDEPENDENT_AMBULATORY_CARE_PROVIDER_SITE_OTHER): Payer: 59

## 2018-07-06 ENCOUNTER — Ambulatory Visit (INDEPENDENT_AMBULATORY_CARE_PROVIDER_SITE_OTHER): Payer: 59 | Admitting: Family Medicine

## 2018-07-06 VITALS — BP 116/74 | HR 93 | Temp 98.2°F | Ht 59.45 in | Wt 186.0 lb

## 2018-07-06 DIAGNOSIS — M21071 Valgus deformity, not elsewhere classified, right ankle: Secondary | ICD-10-CM

## 2018-07-06 DIAGNOSIS — M21072 Valgus deformity, not elsewhere classified, left ankle: Secondary | ICD-10-CM

## 2018-07-06 DIAGNOSIS — G8929 Other chronic pain: Secondary | ICD-10-CM

## 2018-07-06 DIAGNOSIS — M25572 Pain in left ankle and joints of left foot: Secondary | ICD-10-CM | POA: Diagnosis not present

## 2018-07-06 DIAGNOSIS — M19072 Primary osteoarthritis, left ankle and foot: Secondary | ICD-10-CM | POA: Diagnosis not present

## 2018-07-06 NOTE — Patient Instructions (Signed)
TRY [facility] TO OBTAIN ORTHOTICS.  GET A LACE UP ANKLE BRACE AND WEAR DURING THE DAY.  USE ICE, MOTRIN, AS NEEDED.  LET ME KNOW IF NOT IMPROVING IN 1 MONTH. CALL AND I WILL ASK SPORTS MEDICINE TO SEE YOU.

## 2018-07-08 ENCOUNTER — Encounter: Payer: Self-pay | Admitting: Family Medicine

## 2018-10-04 DIAGNOSIS — Z719 Counseling, unspecified: Secondary | ICD-10-CM | POA: Diagnosis not present

## 2018-10-06 DIAGNOSIS — Z719 Counseling, unspecified: Secondary | ICD-10-CM | POA: Diagnosis not present

## 2018-10-13 DIAGNOSIS — Z719 Counseling, unspecified: Secondary | ICD-10-CM | POA: Diagnosis not present

## 2018-10-18 DIAGNOSIS — J101 Influenza due to other identified influenza virus with other respiratory manifestations: Secondary | ICD-10-CM | POA: Diagnosis not present

## 2018-10-18 DIAGNOSIS — R6889 Other general symptoms and signs: Secondary | ICD-10-CM | POA: Diagnosis not present

## 2018-10-20 DIAGNOSIS — Z719 Counseling, unspecified: Secondary | ICD-10-CM | POA: Diagnosis not present

## 2018-10-27 DIAGNOSIS — Z719 Counseling, unspecified: Secondary | ICD-10-CM | POA: Diagnosis not present

## 2018-11-01 ENCOUNTER — Telehealth: Payer: Self-pay

## 2018-11-01 NOTE — Telephone Encounter (Signed)
Patient dropped off form for [place] to fill out for short term disability for flu. She was dx by work. We did not see her for it. Can we fill out ppw?

## 2018-11-01 NOTE — Telephone Encounter (Signed)
Please advise 

## 2018-11-02 NOTE — Telephone Encounter (Signed)
Unfortunately, cannot complete paperwork since we did not see her for the issues.

## 2018-11-02 NOTE — Telephone Encounter (Signed)
VM not set up yet.

## 2018-11-03 NOTE — Telephone Encounter (Signed)
I spoke to Pt and informed her that we can not fill out forms due to  Dr. Earlene Plater did not see her for the illness.  Pt verbalized understanding.

## 2018-12-16 DIAGNOSIS — M25569 Pain in unspecified knee: Secondary | ICD-10-CM | POA: Diagnosis not present

## 2019-01-11 ENCOUNTER — Encounter: Payer: 59 | Admitting: Family Medicine

## 2019-03-10 IMAGING — MG DIGITAL SCREENING BILATERAL MAMMOGRAM WITH TOMO AND CAD
4 series · 4 of 8 positions shown · non-contrast
Comparison: None.

CLINICAL DATA: Screening.

EXAM:
DIGITAL SCREENING BILATERAL MAMMOGRAM WITH TOMO AND CAD

[R MLO synth-2D]
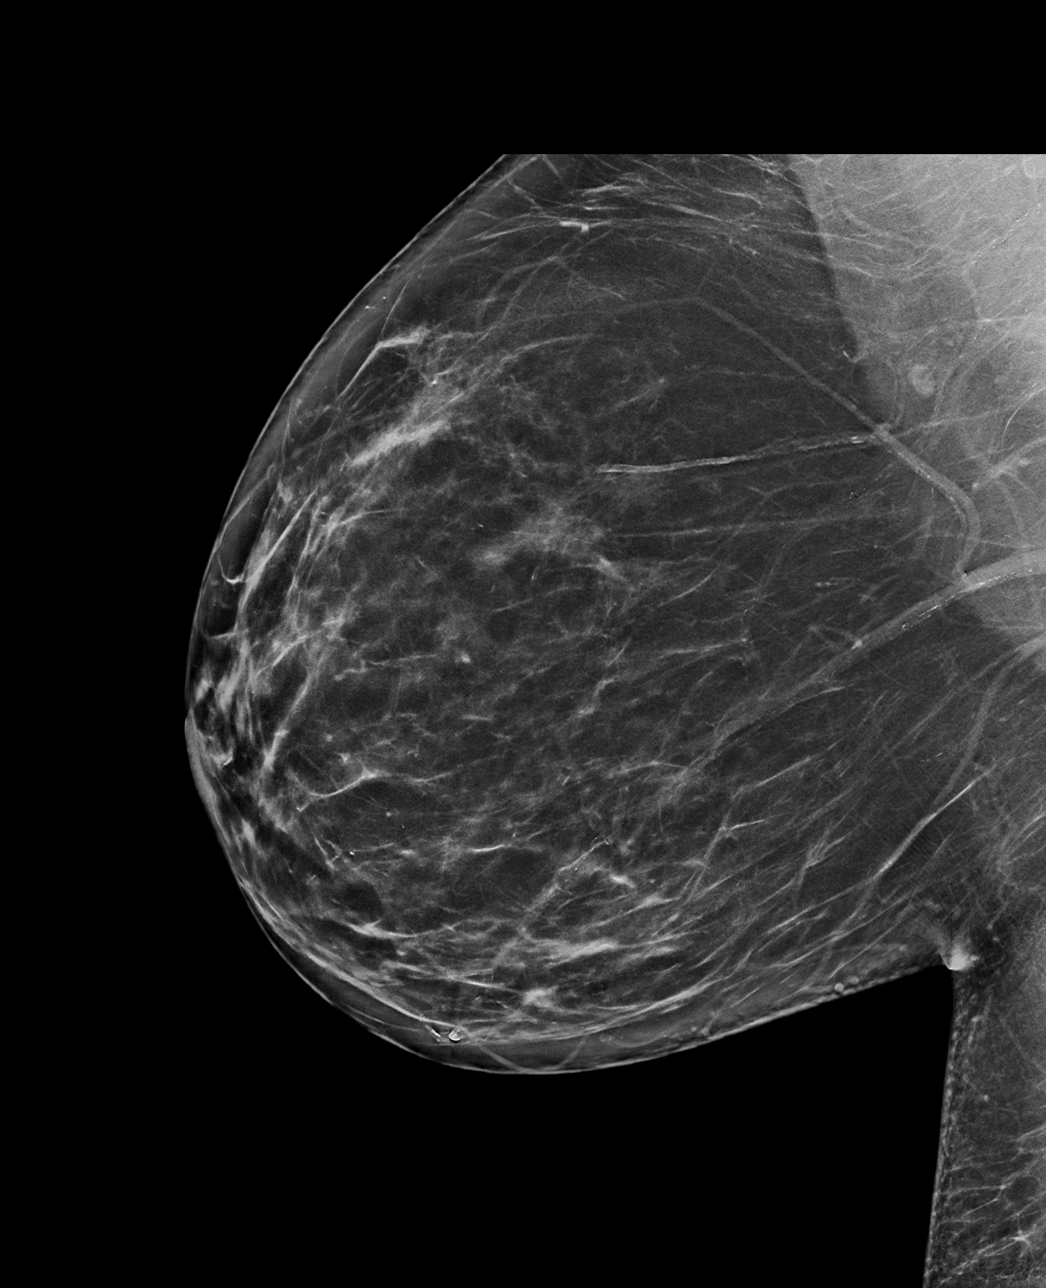

[R CC synth-2D]
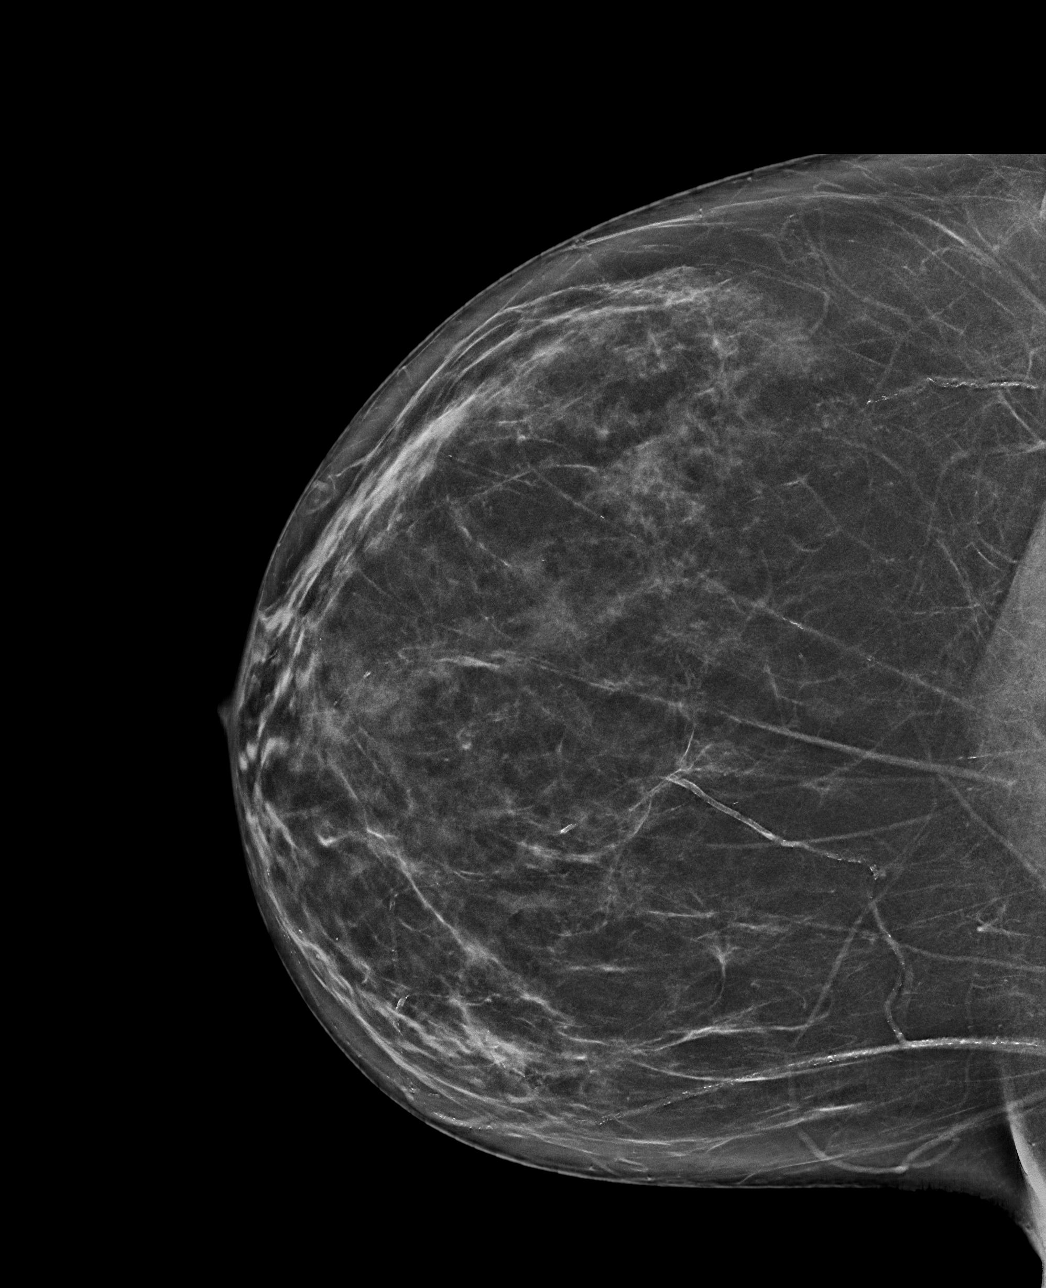

[L CC synth-2D]
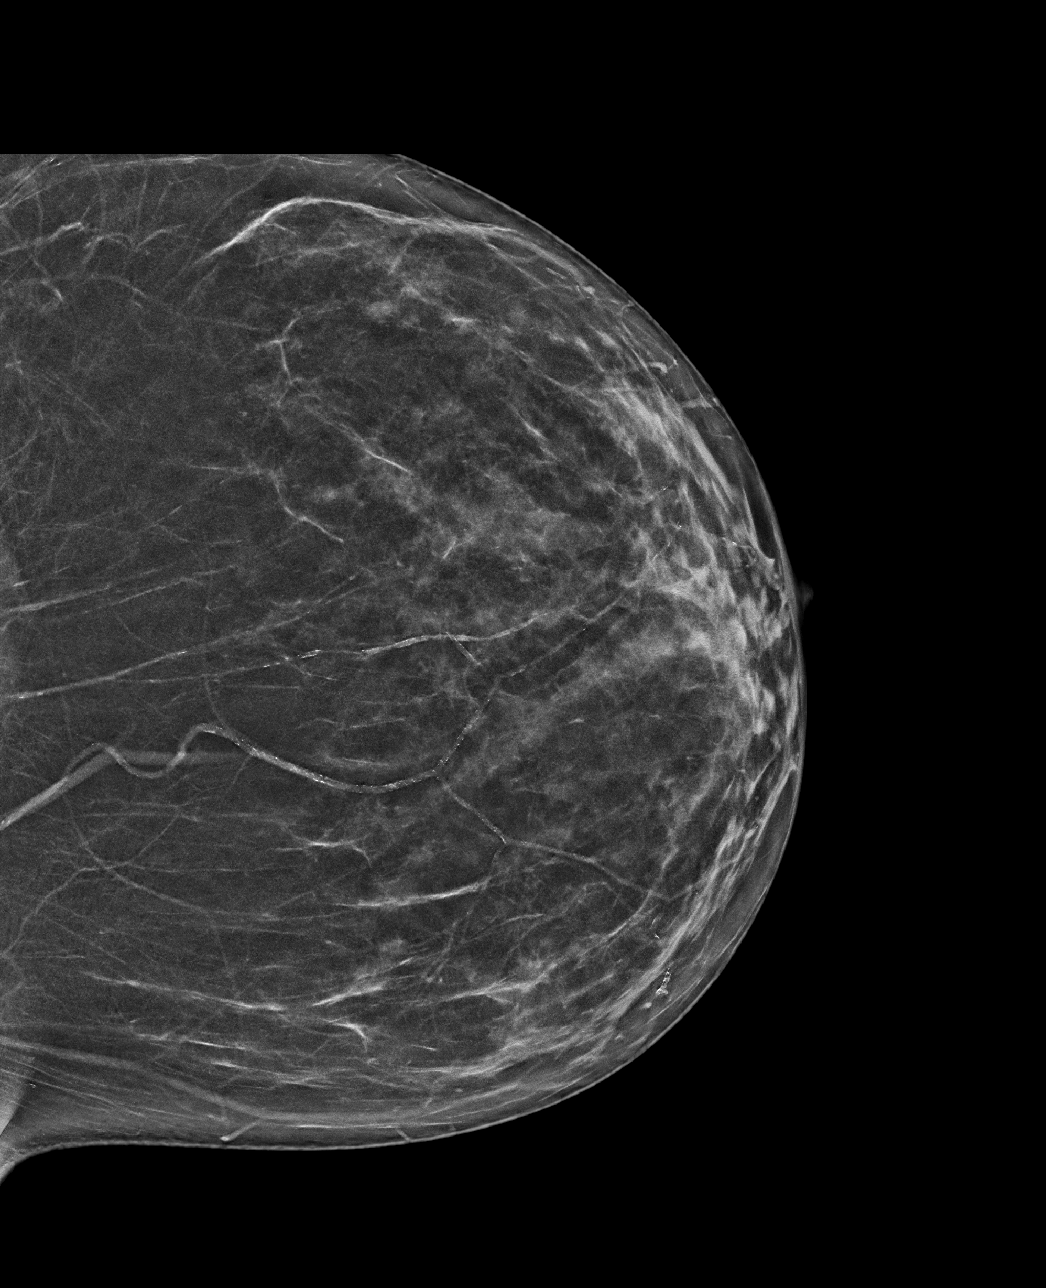

[L MLO tomo · tomo slice 41/81.0]
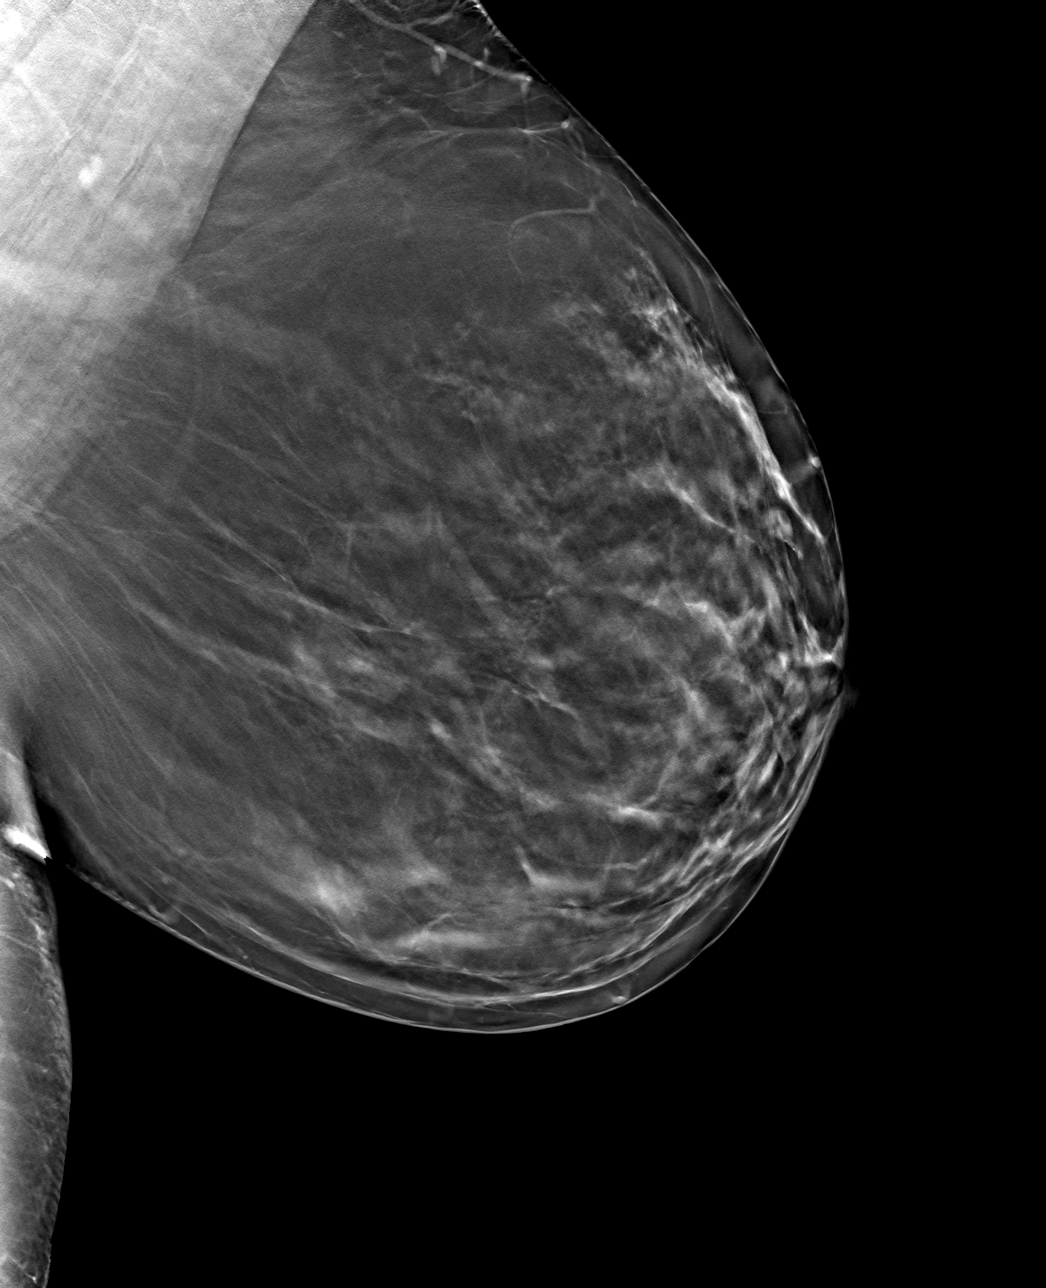

[4 of 8 positions shown; findings below may reference images not displayed]

ACR Breast Density Category b: There are scattered areas of
fibroglandular density.
FINDINGS: There are no findings suspicious for malignancy. Images were
processed with CAD.
IMPRESSION: No mammographic evidence of malignancy. A result letter of this
screening mammogram will be mailed directly to the patient.

RECOMMENDATION:
Screening mammogram in one year. (Code:[id number]-EJ6)

BI-RADS CATEGORY  1: Negative.

## 2019-03-29 ENCOUNTER — Ambulatory Visit (INDEPENDENT_AMBULATORY_CARE_PROVIDER_SITE_OTHER): Payer: 59 | Admitting: Family Medicine

## 2019-03-29 ENCOUNTER — Encounter: Payer: Self-pay | Admitting: Family Medicine

## 2019-03-29 ENCOUNTER — Other Ambulatory Visit: Payer: Self-pay

## 2019-03-29 VITALS — BP 102/66 | HR 91 | Temp 98.4°F | Ht 59.0 in | Wt 197.0 lb

## 2019-03-29 DIAGNOSIS — Z0000 Encounter for general adult medical examination without abnormal findings: Secondary | ICD-10-CM

## 2019-03-29 DIAGNOSIS — E7800 Pure hypercholesterolemia, unspecified: Secondary | ICD-10-CM | POA: Diagnosis not present

## 2019-03-29 DIAGNOSIS — E559 Vitamin D deficiency, unspecified: Secondary | ICD-10-CM

## 2019-03-29 DIAGNOSIS — Z975 Presence of (intrauterine) contraceptive device: Secondary | ICD-10-CM | POA: Diagnosis not present

## 2019-03-29 NOTE — Progress Notes (Signed)
Subjective:    Sabrina Holmes is a 43 y.o. female and is here for a comprehensive physical exam.  There are no preventive care reminders to display for this patient.  No current outpatient medications on file.  PMHx, SurgHx, SocialHx, Medications, and Allergies were reviewed in the Visit Navigator and updated as appropriate.   History reviewed. No pertinent past medical history.   Past Surgical History:  Procedure Laterality Date  . CESAREAN SECTION  2011   x 1  . INTRAUTERINE DEVICE (IUD) INSERTION     inserted 3/15 mirena, removed 04/19/14     Family History  Problem Relation Age of Onset  . Hypertension Mother   . Diabetes Mother   . Diabetes Father   . Hypertension Father   . Asthma Brother   . Breast cancer Paternal Aunt   . Breast cancer Maternal Grandmother   . Breast cancer Paternal Grandmother    Social History   Tobacco Use  . Smoking status: Never Smoker  . Smokeless tobacco: Never Used  Substance Use Topics  . Alcohol use: No  . Drug use: No   Review of Systems:   Pertinent items are noted in the HPI. Otherwise, ROS is negative.  Objective:   BP 102/66 (BP Location: Left Arm, Patient Position: Sitting, Cuff Size: Large)   Pulse 91   Temp 98.4 F (36.9 C) (Oral)   Ht 4\' 11"  (1.499 m)   Wt 197 lb (89.4 kg)   LMP 03/29/2019 (Exact Date)   SpO2 96%   BMI 39.79 kg/m   General appearance: alert, cooperative and appears stated age. Head: normocephalic, without obvious abnormality, atraumatic. Neck: no adenopathy, supple, symmetrical, trachea midline; thyroid not enlarged, symmetric, no tenderness/mass/nodules. Lungs: clear to auscultation bilaterally. Heart: regular rate and rhythm Abdomen: soft, non-tender; no masses,  no organomegaly. Extremities: extremities normal, atraumatic, no cyanosis or edema. Skin: skin color, texture, turgor normal, no rashes or lesions. Lymph: cervical, supraclavicular, and axillary nodes normal; no abnormal  inguinal nodes palpated. Neurologic: grossly normal.                    Assessment/Plan:   Sabrina Holmes was seen today for annual exam.  Diagnoses and all orders for this visit:  Routine physical examination  Pure hypercholesterolemia -     Comprehensive metabolic panel -     Lipid panel  Vitamin D deficiency -     VITAMIN D 25 Hydroxy (Vit-D Deficiency, Fractures)  IUD (intrauterine device) in place -     CBC with Differential/Platelet   Patient Counseling: [x]    Nutrition: Stressed importance of moderation in sodium/caffeine intake, saturated fat and cholesterol, caloric balance, sufficient intake of fresh fruits, vegetables, fiber, calcium, iron, and 1 mg of folate supplement per day (for females capable of pregnancy).  [x]    Stressed the importance of regular exercise.   [x]    Substance Abuse: Discussed cessation/primary prevention of tobacco, alcohol, or other drug use; driving or other dangerous activities under the influence; availability of treatment for abuse.   [x]    Injury prevention: Discussed safety belts, safety helmets, smoke detector, smoking near bedding or upholstery.   [x]    Sexuality: Discussed sexually transmitted diseases, partner selection, use of condoms, avoidance of unintended pregnancy  and contraceptive alternatives.  [x]    Dental health: Discussed importance of regular tooth brushing, flossing, and dental visits.  [x]    Health maintenance and immunizations reviewed. Please refer to Health maintenance section.   Briscoe Deutscher, DO [facility]  [place]

## 2019-03-30 ENCOUNTER — Encounter: Payer: Self-pay | Admitting: Family Medicine

## 2019-03-30 LAB — COMPREHENSIVE METABOLIC PANEL
ALT: 13 U/L (ref 0–35)
AST: 14 U/L (ref 0–37)
Albumin: 3.9 g/dL (ref 3.5–5.2)
Alkaline Phosphatase: 56 U/L (ref 39–117)
BUN: 12 mg/dL (ref 6–23)
CO2: 27 mEq/L (ref 19–32)
Calcium: 9.2 mg/dL (ref 8.4–10.5)
Chloride: 105 mEq/L (ref 96–112)
Creatinine, Ser: 0.82 mg/dL (ref 0.40–1.20)
GFR: 92.79 mL/min (ref >60.00)
Glucose, Bld: 85 mg/dL (ref 70–99)
Potassium: 4.4 mEq/L (ref 3.5–5.1)
Sodium: 140 mEq/L (ref 135–145)
Total Bilirubin: 0.3 mg/dL (ref 0.2–1.2)
Total Protein: 6.5 g/dL (ref 6.0–8.3)

## 2019-03-30 LAB — CBC WITH DIFFERENTIAL/PLATELET
Basophils Absolute: 0.1 10*3/uL (ref 0.0–0.1)
Basophils Relative: 1 % (ref 0.0–3.0)
Eosinophils Absolute: 0.1 10*3/uL (ref 0.0–0.7)
Eosinophils Relative: 1.8 % (ref 0.0–5.0)
HCT: 37.3 % (ref 36.0–46.0)
Hemoglobin: 12 g/dL (ref 12.0–15.0)
Lymphocytes Relative: 27.5 % (ref 12.0–46.0)
Lymphs Abs: 2.1 10*3/uL (ref 0.7–4.0)
MCHC: 32.2 g/dL (ref 30.0–36.0)
MCV: 82.6 fl (ref 78.0–100.0)
Monocytes Absolute: 0.6 10*3/uL (ref 0.1–1.0)
Monocytes Relative: 8.2 % (ref 3.0–12.0)
Neutro Abs: 4.8 10*3/uL (ref 1.4–7.7)
Neutrophils Relative %: 61.5 % (ref 43.0–77.0)
Platelets: 357 10*3/uL (ref 150.0–400.0)
RBC: 4.52 Mil/uL (ref 3.87–5.11)
RDW: 14.7 % (ref 11.5–15.5)
WBC: 7.7 10*3/uL (ref 4.0–10.5)

## 2019-03-30 LAB — LIPID PANEL
Cholesterol: 186 mg/dL (ref 0–200)
HDL: 58.2 mg/dL (ref >39.00)
LDL Cholesterol: 105 mg/dL — ABNORMAL HIGH (ref 0–99)
NonHDL: 127.99
Total CHOL/HDL Ratio: 3
Triglycerides: 114 mg/dL (ref 0.0–149.0)
VLDL: 22.8 mg/dL (ref 0.0–40.0)

## 2019-03-30 LAB — VITAMIN D 25 HYDROXY (VIT D DEFICIENCY, FRACTURES): VITD: 18.48 ng/mL — ABNORMAL LOW (ref 30.00–100.00)

## 2019-05-09 ENCOUNTER — Other Ambulatory Visit: Payer: Self-pay

## 2019-05-10 NOTE — Progress Notes (Signed)
43 y.o. [id number] Single Black or African American Not Hispanic or Latino female here for annual exam.  Not sexually active.   Period Cycle (Days): 28 Period Duration (Days): 5 days Period Pattern: Regular Menstrual Flow: Moderate Menstrual Control: Thin pad Menstrual Control Change Freq (Hours): changes pad every 4 hours Dysmenorrhea: None  Patient's last menstrual period was 04/25/2019 (exact date).          Sexually active: No.  The current method of family planning is abstinence Exercising: No.  The patient does not participate in regular exercise at present. Smoker:  no  Health Maintenance: Pap:05/05/2018 WNL NEG HPV, 01-10-16 WNL NEG HR HPV 12-07-14 WNL +HR HPV History of abnormal Pap:Yes+ HR HPV 2016 MMG:06/01/2018 Birads 1 negative Colonoscopy:Never NFA:OZHYQBMD:Never TDaP:05/05/2018 Gardasil:No   reports that she has never smoked. She has never used smokeless tobacco. She reports that she does not drink alcohol or use drugs. She works as an Human resources officerorder processor. She has a701year old daughter  History reviewed. No pertinent past medical history.  Past Surgical History:  Procedure Laterality Date  . CESAREAN SECTION  2011   x 1    No current outpatient medications on file.   No current facility-administered medications for this visit.     Family History  Problem Relation Age of Onset  . Hypertension Mother   . Diabetes Mother   . Diabetes Father   . Hypertension Father   . Asthma Brother   . Breast cancer Paternal Aunt   . Breast cancer Maternal Grandmother   . Breast cancer Paternal Grandmother     Review of Systems  Constitutional: Negative.   HENT: Negative.   Eyes: Negative.   Respiratory: Negative.   Cardiovascular: Negative.   Gastrointestinal: Negative.   Endocrine: Negative.   Genitourinary: Negative.   Musculoskeletal: Positive for neck pain.  Skin: Negative.   Allergic/Immunologic: Negative.   Neurological: Negative.   Hematological:  Negative.   Psychiatric/Behavioral: Negative.     Exam:   BP 116/72 (BP Location: Right Arm, Patient Position: Sitting, Cuff Size: Normal)   Pulse 80   Temp 98.2 F (36.8 C) (Skin)   Ht 4' 11.45" (1.51 m)   Wt 198 lb (89.8 kg)   LMP 04/25/2019 (Exact Date)   BMI 39.39 kg/m   Weight change: @WEIGHTCHANGE @ Height:   Height: 4' 11.45" (151 cm)  Ht Readings from Last 3 Encounters:  05/11/19 4' 11.45" (1.51 m)  03/29/19 4\' 11"  (1.499 m)  07/06/18 4' 11.45" (1.51 m)    General appearance: alert, cooperative and appears stated age Head: Normocephalic, without obvious abnormality, atraumatic Neck: no adenopathy, supple, symmetrical, trachea midline and thyroid normal to inspection and palpation Lungs: clear to auscultation bilaterally Cardiovascular: regular rate and rhythm Breasts: normal appearance, no masses or tenderness Abdomen: soft, non-tender; non distended,  no masses,  no organomegaly Extremities: extremities normal, atraumatic, no cyanosis or edema Skin: Skin color, texture, turgor normal. No rashes or lesions Lymph nodes: Cervical, supraclavicular, and axillary nodes normal. No abnormal inguinal nodes palpated Neurologic: Grossly normal   Pelvic: External genitalia:  no lesions              Urethra:  normal appearing urethra with no masses, tenderness or lesions              Bartholins and Skenes: normal                 Vagina: normal appearing vagina with normal color and discharge, no lesions  Cervix: no lesions               Bimanual Exam:  Uterus:  normal size, contour, position, consistency, mobility, non-tender              Adnexa: no mass, fullness, tenderness               Rectovaginal: Confirms               Anus:  normal sphincter tone, no lesions  Chaperone was present for exam.  A:  Well Woman with normal exam  21 lb weight gain in the last year  BMI 39  P:   No pap this year  Labs with primary MD  Discussed breast self exam  Discussed  calcium and vit D intake   Mammogram due next month  Discussed exercise, eating healthy, portion control  Discussed weight watchers

## 2019-05-11 ENCOUNTER — Other Ambulatory Visit: Payer: Self-pay

## 2019-05-11 ENCOUNTER — Encounter: Payer: Self-pay | Admitting: Obstetrics and Gynecology

## 2019-05-11 ENCOUNTER — Ambulatory Visit (INDEPENDENT_AMBULATORY_CARE_PROVIDER_SITE_OTHER): Payer: 59 | Admitting: Obstetrics and Gynecology

## 2019-05-11 VITALS — BP 116/72 | HR 80 | Temp 98.2°F | Ht 59.45 in | Wt 198.0 lb

## 2019-05-11 DIAGNOSIS — Z01419 Encounter for gynecological examination (general) (routine) without abnormal findings: Secondary | ICD-10-CM | POA: Diagnosis not present

## 2019-05-11 DIAGNOSIS — Z6839 Body mass index (BMI) 39.0-39.9, adult: Secondary | ICD-10-CM | POA: Diagnosis not present

## 2019-05-11 NOTE — Patient Instructions (Signed)

## 2019-05-13 ENCOUNTER — Other Ambulatory Visit: Payer: Self-pay | Admitting: Nurse Practitioner

## 2019-05-13 DIAGNOSIS — Z1231 Encounter for screening mammogram for malignant neoplasm of breast: Secondary | ICD-10-CM

## 2019-06-28 ENCOUNTER — Ambulatory Visit: Payer: 59

## 2020-03-30 ENCOUNTER — Other Ambulatory Visit: Payer: Self-pay

## 2020-03-30 ENCOUNTER — Ambulatory Visit (INDEPENDENT_AMBULATORY_CARE_PROVIDER_SITE_OTHER): Payer: 59 | Admitting: Family Medicine

## 2020-03-30 ENCOUNTER — Encounter: Payer: Self-pay | Admitting: Family Medicine

## 2020-03-30 ENCOUNTER — Encounter: Payer: 59 | Admitting: Family Medicine

## 2020-03-30 VITALS — BP 122/60 | HR 116 | Temp 98.3°F | Resp 18 | Ht 59.0 in | Wt 228.0 lb

## 2020-03-30 DIAGNOSIS — Z0000 Encounter for general adult medical examination without abnormal findings: Secondary | ICD-10-CM

## 2020-03-30 DIAGNOSIS — Z2821 Immunization not carried out because of patient refusal: Secondary | ICD-10-CM | POA: Insufficient documentation

## 2020-03-30 DIAGNOSIS — L230 Allergic contact dermatitis due to metals: Secondary | ICD-10-CM | POA: Insufficient documentation

## 2020-03-30 DIAGNOSIS — G5601 Carpal tunnel syndrome, right upper limb: Secondary | ICD-10-CM

## 2020-03-30 HISTORY — DX: Morbid (severe) obesity due to excess calories: E66.01

## 2020-03-30 NOTE — Patient Instructions (Signed)
Please return in 12 months for your annual complete physical; please come fasting.  I will release your lab results to you on your MyChart account with further instructions. Please reply with any questions.   If you have any questions or concerns, please don't hesitate to send me a message via MyChart or call the office at [phone]. Thank you for visiting with [place] today! It's our pleasure caring for you.   Fat and Cholesterol Restricted Eating Plan Getting too much fat and cholesterol in your diet may cause health problems. Choosing the right foods helps keep your fat and cholesterol at normal levels. This can keep you from getting certain diseases. Your doctor may recommend an eating plan that includes:  Total fat: ______% or less of total calories a day.  Saturated fat: ______% or less of total calories a day.  Cholesterol: less than _________mg a day.  Fiber: ______g a day. What are tips for following this plan? Meal planning  At meals, divide your plate into four equal parts: ? Fill one-half of your plate with vegetables and green salads. ? Fill one-fourth of your plate with whole grains. ? Fill one-fourth of your plate with low-fat (lean) protein foods.  Eat fish that is high in omega-3 fats at least two times a week. This includes mackerel, tuna, sardines, and salmon.  Eat foods that are high in fiber, such as whole grains, beans, apples, broccoli, carrots, peas, and barley. General tips   Work with your doctor to lose weight if you need to.  Avoid: ? Foods with added sugar. ? Fried foods. ? Foods with partially hydrogenated oils.  Limit alcohol intake to no more than 1 drink a day for nonpregnant women and 2 drinks a day for men. One drink equals 12 oz of beer, 5 oz of wine, or 1 oz of hard liquor. Reading food labels  Check food labels for: ? Trans fats. ? Partially hydrogenated oils. ? Saturated fat (g) in each serving. ? Cholesterol (mg) in each  serving. ? Fiber (g) in each serving.  Choose foods with healthy fats, such as: ? Monounsaturated fats. ? Polyunsaturated fats. ? Omega-3 fats.  Choose grain products that have whole grains. Look for the word "whole" as the first word in the ingredient list. Cooking  Cook foods using low-fat methods. These include baking, boiling, grilling, and broiling.  Eat more home-cooked foods. Eat at restaurants and buffets less often.  Avoid cooking using saturated fats, such as butter, cream, palm oil, palm kernel oil, and coconut oil. Recommended foods  Fruits  All fresh, canned (in natural juice), or frozen fruits. Vegetables  Fresh or frozen vegetables (raw, steamed, roasted, or grilled). Green salads. Grains  Whole grains, such as whole wheat or whole grain breads, crackers, cereals, and pasta. Unsweetened oatmeal, bulgur, barley, quinoa, or brown rice. Corn or whole wheat flour tortillas. Meats and other protein foods  Ground beef (85% or leaner), grass-fed beef, or beef trimmed of fat. Skinless chicken or [place]. Ground chicken or [place]. Pork trimmed of fat. All fish and seafood. Egg whites. Dried beans, peas, or lentils. Unsalted nuts or seeds. Unsalted canned beans. Nut butters without added sugar or oil. Dairy  Low-fat or nonfat dairy products, such as skim or 1% milk, 2% or reduced-fat cheeses, low-fat and fat-free ricotta or cottage cheese, or plain low-fat and nonfat yogurt. Fats and oils  Tub margarine without trans fats. Light or reduced-fat mayonnaise and salad dressings. Avocado. Olive, canola, sesame, or safflower oils.  The items listed above may not be a complete list of foods and beverages you can eat. Contact a dietitian for more information. Foods to avoid Fruits  Canned fruit in heavy syrup. Fruit in cream or butter sauce. Fried fruit. Vegetables  Vegetables cooked in cheese, cream, or butter sauce. Fried vegetables. Grains  White bread. White pasta. White  rice. Cornbread. Bagels, pastries, and croissants. Crackers and snack foods that contain trans fat and hydrogenated oils. Meats and other protein foods  Fatty cuts of meat. Ribs, chicken wings, bacon, sausage, bologna, salami, chitterlings, fatback, hot dogs, bratwurst, and packaged lunch meats. Liver and organ meats. Whole eggs and egg yolks. Chicken and [place] with skin. Fried meat. Dairy  Whole or 2% milk, cream, half-and-half, and cream cheese. Whole milk cheeses. Whole-fat or sweetened yogurt. Full-fat cheeses. Nondairy creamers and whipped toppings. Processed cheese, cheese spreads, and cheese curds. Beverages  Alcohol. Sugar-sweetened drinks such as sodas, lemonade, and fruit drinks. Fats and oils  Butter, stick margarine, lard, shortening, ghee, or bacon fat. Coconut, palm kernel, and palm oils. Sweets and desserts  Corn syrup, sugars, honey, and molasses. Candy. Jam and jelly. Syrup. Sweetened cereals. Cookies, pies, cakes, donuts, muffins, and ice cream. The items listed above may not be a complete list of foods and beverages you should avoid. Contact a dietitian for more information. Summary  Choosing the right foods helps keep your fat and cholesterol at normal levels. This can keep you from getting certain diseases.  At meals, fill one-half of your plate with vegetables and green salads.  Eat high-fiber foods, like whole grains, beans, apples, carrots, peas, and barley.  Limit added sugar, saturated fats, alcohol, and fried foods. This information is not intended to replace advice given to you by your health care provider. Make sure you discuss any questions you have with your health care provider. Document Revised: 05/12/2018 Document Reviewed: 05/26/2017 Elsevier Patient Education  2020 [employer].   Exercise to Lose Weight Exercise and a healthy diet may help you lose weight. Your doctor may suggest specific exercises. EXERCISE IDEAS AND TIPS  Choose low-cost  things you enjoy doing, such as walking, bicycling, or exercising to workout videos.  Take stairs instead of the elevator.  Walk during your lunch break.  Park your car further away from work or school.  Go to a gym or an exercise class.  Start with 5 to 10 minutes of exercise each day. Build up to 30 minutes of exercise 4 to 6 days a week.  Wear shoes with good support and comfortable clothes.  Stretch before and after working out.  Work out until you breathe harder and your heart beats faster.  Drink extra water when you exercise.  Do not do so much that you hurt yourself, feel dizzy, or get very short of breath. Exercises that burn about 150 calories:  Running 1  miles in 15 minutes.  Playing volleyball for 45 to 60 minutes.  Washing and waxing a car for 45 to 60 minutes.  Playing touch football for 45 minutes.  Walking 1  miles in 35 minutes.  Pushing a stroller 1  miles in 30 minutes.  Playing basketball for 30 minutes.  Raking leaves for 30 minutes.  Bicycling 5 miles in 30 minutes.  Walking 2 miles in 30 minutes.  Dancing for 30 minutes.  Shoveling snow for 15 minutes.  Swimming laps for 20 minutes.  Walking up stairs for 15 minutes.  Bicycling 4 miles in 15 minutes.  Gardening for 30 to 45 minutes.  Jumping rope for 15 minutes.  Washing windows or floors for 45 to 60 minutes.

## 2020-03-30 NOTE — Progress Notes (Signed)
Subjective  Chief Complaint  Patient presents with  . Transitions Of Care    Non-fasting labs  . Skin concerns    She is starting to get moles on her neck and she would like to discuss if that is normal or not. There is a spot on her left wrist that she would like to have looked at. She's not sure if her skin is irritated because her fitbit that she wears on her wrist   . Health Maintenance    Has not received her covid vaccine    HPI: Sabrina Holmes is a 44 y.o. female who presents to [facility] [place] today for a Female Wellness Visit.  Patient transferring care, I reviewed old charts.  Sees Dr. Laural Benes for GYN and had recent CPE Wellness Visit: annual visit with health maintenance review and exam without Pap   Health maintenance: Screens are up-to-date.  She is single and lives with her 21 year old daughter, and her brother.  She goes online school for business education.  She declines vaccinations including the ones and Covid vaccines.  Obesity: She has gained about 40 pounds losing her job last year.  We discussed diet in detail.  Has small rash that sits beneath where her watch lies.  Occasional itching mild red bumps.  None spreading.  Resolves when she does not wear her watch  Intermittent right paresthesias with numbness and tingling described in the right first few fingers, worse at night.  No weakness.  No pain.  No injury.  Assessment  1. Annual physical exam   2. No vaccination-pt refuse   3. Morbid obesity (HCC)   4. Nickel dermatitis   5. Carpal tunnel syndrome, right      Plan  Female Wellness Visit:  Age appropriate Health Maintenance and Prevention measures were discussed with patient. Included topics are cancer screening recommendations, ways to keep healthy (see AVS) including dietary and exercise recommendations, regular eye and dental care, use of seat belts, and avoidance of moderate alcohol use and tobacco use.   BMI: discussed  patient's BMI and encouraged positive lifestyle modifications to help get to or maintain a target BMI.  Patient declines further nutrition counseling or intervention at this time.  HM needs and immunizations were addressed and ordered. See below for orders. See HM and immunization section for updates.  Routine labs and screening tests ordered including cmp, cbc and lipids where appropriate.  Discussed recommendations regarding Vit D and calcium supplementation (see AVS)  Mild carpal tunnel, splints and NSAIDs as needed  Nipple dermatitis: Education given.  Over-the-counter steroids if needed, change watch or cover the back of her watch with a nonmetal surface.   Follow up: Return in about 1 year (around 03/30/2021) for complete physical.   Orders Placed This Encounter  Procedures  . CBC with Differential/Platelet  . Comprehensive metabolic panel  . Lipid panel   No orders of the defined types were placed in this encounter.    Lifestyle: Body mass index is 46.05 kg/m. Wt Readings from Last 3 Encounters:  03/30/20 228 lb (103.4 kg)  05/11/19 198 lb (89.8 kg)  03/29/19 197 lb (89.4 kg)     Patient Active Problem List   Diagnosis Date Noted  . No vaccination-pt refuse 03/30/2020  . Morbid obesity (HCC) 03/30/2020  . Nickel dermatitis 03/30/2020   Health Maintenance  Topic Date Due  . COVID-19 Vaccine (1) 04/15/2021 (Originally 11/16/1987)  . PAP SMEAR-Modifier  05/06/2023  . TETANUS/TDAP  05/05/2028  .  Hepatitis C Screening  Completed  . HIV Screening  Completed  . INFLUENZA VACCINE  Discontinued   Immunization History  Administered Date(s) Administered  . Tdap 05/05/2018   We updated and reviewed the patient's past history in detail and it is documented below. Allergies: Patient is allergic to penicillins. Past Medical History Patient  has a past medical history of Morbid obesity (HCC) (03/30/2020). Past Surgical History Patient  has a past surgical history that  includes Cesarean section (2011). Family History: Patient family history includes Asthma in her brother; Breast cancer in her maternal grandmother, paternal aunt, and paternal grandmother; Diabetes in her father and mother; Hypertension in her father and mother. Social History:  Patient  reports that she has never smoked. She has never used smokeless tobacco. She reports that she does not drink alcohol and does not use drugs.  Review of Systems: Constitutional: negative for fever or malaise Ophthalmic: negative for photophobia, double vision or loss of vision Cardiovascular: negative for chest pain, dyspnea on exertion, or new LE swelling Respiratory: negative for SOB or persistent cough Gastrointestinal: negative for abdominal pain, change in bowel habits or melena Genitourinary: negative for dysuria or gross hematuria, no abnormal uterine bleeding or disharge Musculoskeletal: negative for new gait disturbance or muscular weakness Integumentary: negative for new or persistent rashes, no breast lumps Neurological: negative for TIA or stroke symptoms Psychiatric: negative for SI or delusions Allergic/Immunologic: negative for hives  Patient Care Team    Relationship Specialty Notifications Start End  Willow Ora, MD PCP - General Family Medicine  03/30/20   Romualdo Bolk, MD Consulting Physician Obstetrics and Gynecology  07/08/18     Objective  Vitals: BP 122/60   Pulse (!) 116   Temp 98.3 F (36.8 C) (Temporal)   Resp 18   Ht 4\' 11"  (1.499 m)   Wt 228 lb (103.4 kg)   LMP 03/13/2020 (Exact Date)   SpO2 95%   BMI 46.05 kg/m  General:  Well developed, well nourished, no acute distress, obese Psych:  Alert and orientedx3,normal mood and affect HEENT:  Normocephalic, atraumatic, non-icteric sclera, supple neck without adenopathy, mass or thyromegaly Cardiovascular:  Normal S1, S2, RRR without gallop, rub or murmur Respiratory:  Good breath sounds bilaterally, CTAB with  normal respiratory effort Gastrointestinal: normal bowel sounds, soft, non-tender, no noted masses. No HSM MSK: no deformities, contusions. Joints are without erythema or swelling.  Skin:  Warm, maculopapular rash on dorsal left wrist, no suspicious lesions noted Neurologic:    Mental status is normal. CN 2-11 are normal. Gross motor and sensory exams are normal. Normal gait. No tremor   Commons side effects, risks, benefits, and alternatives for medications and treatment plan prescribed today were discussed, and the patient expressed understanding of the given instructions. Patient is instructed to call or message via MyChart if he/she has any questions or concerns regarding our treatment plan. No barriers to understanding were identified. We discussed Red Flag symptoms and signs in detail. Patient expressed understanding regarding what to do in case of urgent or emergency type symptoms.   Medication list was reconciled, printed and provided to the patient in AVS. Patient instructions and summary information was reviewed with the patient as documented in the AVS. This note was prepared with assistance of Dragon voice recognition software. Occasional wrong-word or sound-a-like substitutions may have occurred due to the inherent limitations of voice recognition software  This visit occurred during the SARS-CoV-2 public health emergency.  Safety protocols were in place,  including screening questions prior to the visit, additional usage of staff PPE, and extensive cleaning of exam room while observing appropriate contact time as indicated for disinfecting solutions.

## 2020-03-31 LAB — LIPID PANEL
Cholesterol: 194 mg/dL (ref <200)
HDL: 57 mg/dL (ref >=50)
LDL Cholesterol (Calc): 114 mg/dL (calc) — ABNORMAL HIGH
Non-HDL Cholesterol (Calc): 137 mg/dL (calc) — ABNORMAL HIGH (ref <130)
Total CHOL/HDL Ratio: 3.4 (calc) (ref <5.0)
Triglycerides: 118 mg/dL (ref <150)

## 2020-03-31 LAB — COMPREHENSIVE METABOLIC PANEL
AG Ratio: 1.5 (calc) (ref 1.0–2.5)
ALT: 18 U/L (ref 6–29)
AST: 14 U/L (ref 10–30)
Albumin: 4.1 g/dL (ref 3.6–5.1)
Alkaline phosphatase (APISO): 71 U/L (ref 31–125)
BUN: 9 mg/dL (ref 7–25)
CO2: 23 mmol/L (ref 20–32)
Calcium: 9.4 mg/dL (ref 8.6–10.2)
Chloride: 105 mmol/L (ref 98–110)
Creat: 0.82 mg/dL (ref 0.50–1.10)
Globulin: 2.7 g/dL (calc) (ref 1.9–3.7)
Glucose, Bld: 156 mg/dL — ABNORMAL HIGH (ref 65–99)
Potassium: 4.3 mmol/L (ref 3.5–5.3)
Sodium: 138 mmol/L (ref 135–146)
Total Bilirubin: 0.2 mg/dL (ref 0.2–1.2)
Total Protein: 6.8 g/dL (ref 6.1–8.1)

## 2020-03-31 LAB — CBC WITH DIFFERENTIAL/PLATELET
Absolute Monocytes: 466 cells/uL (ref 200–950)
Basophils Absolute: 67 cells/uL (ref 0–200)
Basophils Relative: 0.9 %
Eosinophils Absolute: 118 cells/uL (ref 15–500)
Eosinophils Relative: 1.6 %
HCT: 39.5 % (ref 35.0–45.0)
Hemoglobin: 13 g/dL (ref 11.7–15.5)
Lymphs Abs: 2065 cells/uL (ref 850–3900)
MCH: 25.8 pg — ABNORMAL LOW (ref 27.0–33.0)
MCHC: 32.9 g/dL (ref 32.0–36.0)
MCV: 78.5 fL — ABNORMAL LOW (ref 80.0–100.0)
MPV: 8.7 fL (ref 7.5–12.5)
Monocytes Relative: 6.3 %
Neutro Abs: 4684 cells/uL (ref 1500–7800)
Neutrophils Relative %: 63.3 %
Platelets: 440 10*3/uL — ABNORMAL HIGH (ref 140–400)
RBC: 5.03 10*6/uL (ref 3.80–5.10)
RDW: 15.4 % — ABNORMAL HIGH (ref 11.0–15.0)
Total Lymphocyte: 27.9 %
WBC: 7.4 10*3/uL (ref 3.8–10.8)

## 2020-05-10 NOTE — Progress Notes (Signed)
44 y.o. [id number] Single Black or African American Not Hispanic or Latino female here for annual exam.   Period Cycle (Days): 28 Period Duration (Days): 5 Period Pattern: Regular Menstrual Flow: Moderate, Light Menstrual Control: Panty liner, Thin pad, Maxi pad Menstrual Control Change Freq (Hours): 3-4 Dysmenorrhea: NoneShe states that on her right side she is having intermittent, mild ache for the last few weeks. Worse if she lies on her side. No urinary c/o, no hematuria.   She is up 26 lbs in the last year.    Patient's last menstrual period was 04/14/2020.          Sexually active: No.  The current method of family planning is abstinence.    Exercising: No.  The patient does not participate in regular exercise at present. Smoker:  no  Health Maintenance: Pap:  05/05/2018 WNL NEG HPV, 01-10-16 WNL NEG HR HPV 12-07-14 WNL +HR HPV History of abnormal Pap:  Yes Hr HPV 2016 MMG:  06/02/18 Density B- Birads 1 neg  BMD:   Never  Colonoscopy: Never  TDaP: 05/05/18  Gardasil: no   reports that she has never smoked. She has never used smokeless tobacco. She reports that she does not drink alcohol and does not use drugs. She got laid off with covid, looking for a job currently. She has a32year old daughter  Past Medical History:  Diagnosis Date  . Morbid obesity (HCC) 03/30/2020    Past Surgical History:  Procedure Laterality Date  . CESAREAN SECTION  2011   x 1    No current outpatient medications on file.   No current facility-administered medications for this visit.    Family History  Problem Relation Age of Onset  . Hypertension Mother   . Diabetes Mother   . Diabetes Father   . Hypertension Father   . Asthma Brother   . Breast cancer Paternal Aunt   . Breast cancer Maternal Grandmother   . Breast cancer Paternal Grandmother     Review of Systems  Genitourinary: Positive for flank pain.  All other systems reviewed and are negative.   Exam:   BP 124/72   Pulse 77    Ht 4\' 11"  (1.499 m)   Wt 224 lb (101.6 kg)   LMP 04/14/2020   SpO2 99%   BMI 45.24 kg/m   Weight change: @WEIGHTCHANGE @ Height:   Height: 4\' 11"  (149.9 cm)  Ht Readings from Last 3 Encounters:  05/14/20 4\' 11"  (1.499 m)  03/30/20 4\' 11"  (1.499 m)  05/11/19 4' 11.45" (1.51 m)    General appearance: alert, cooperative and appears stated age Head: Normocephalic, without obvious abnormality, atraumatic Neck: no adenopathy, supple, symmetrical, trachea midline and thyroid normal to inspection and palpation Lungs: clear to auscultation bilaterally Cardiovascular: regular rate and rhythm Breasts: normal appearance, no masses or tenderness Abdomen: soft, non-tender; non distended,  no masses,  no organomegaly Extremities: extremities normal, atraumatic, no cyanosis or edema Skin: Skin color, texture, turgor normal. No rashes or lesions Lymph nodes: Cervical, supraclavicular, and axillary nodes normal. No abnormal inguinal nodes palpated Neurologic: Grossly normal   Pelvic: External genitalia:  no lesions              Urethra:  normal appearing urethra with no masses, tenderness or lesions              Bartholins and Skenes: normal                 Vagina: normal appearing vagina with normal  color and discharge, no lesions              Cervix: no lesions               Bimanual Exam:  Uterus:  normal size, contour, position, consistency, mobility, non-tender and anteverted              Adnexa: no mass, fullness, tenderness               Rectovaginal: Confirms               Anus:  normal sphincter tone, no lesions  Carolynn Serve chaperoned for the exam.  A:  Well Woman with normal exam  BMI 45, 26 lb weight gain in the last year.  FH of DM, elevated glucose    P:   Pap next year  Discussed weight watchers, discussed lifestyle changes, discussed weight loss clinic and exercise.   Mammogram overdue, she will schedule  Discussed breast self exam  Discussed calcium and vit D  intake  HgbA1C today, other labs UTD with primary

## 2020-05-14 ENCOUNTER — Ambulatory Visit: Payer: 59 | Admitting: Obstetrics and Gynecology

## 2020-05-14 ENCOUNTER — Encounter: Payer: Self-pay | Admitting: Obstetrics and Gynecology

## 2020-05-14 ENCOUNTER — Ambulatory Visit (INDEPENDENT_AMBULATORY_CARE_PROVIDER_SITE_OTHER): Payer: 59 | Admitting: Obstetrics and Gynecology

## 2020-05-14 ENCOUNTER — Other Ambulatory Visit: Payer: Self-pay

## 2020-05-14 VITALS — BP 124/72 | HR 77 | Ht 59.0 in | Wt 224.0 lb

## 2020-05-14 DIAGNOSIS — Z01419 Encounter for gynecological examination (general) (routine) without abnormal findings: Secondary | ICD-10-CM

## 2020-05-14 DIAGNOSIS — Z833 Family history of diabetes mellitus: Secondary | ICD-10-CM | POA: Diagnosis not present

## 2020-05-14 DIAGNOSIS — Z6841 Body Mass Index (BMI) 40.0 and over, adult: Secondary | ICD-10-CM

## 2020-05-14 NOTE — Patient Instructions (Signed)

## 2020-05-15 ENCOUNTER — Encounter: Payer: Self-pay | Admitting: Family Medicine

## 2020-05-15 DIAGNOSIS — R7303 Prediabetes: Secondary | ICD-10-CM

## 2020-05-15 HISTORY — DX: Prediabetes: R73.03

## 2020-05-15 LAB — HEMOGLOBIN A1C
Est. average glucose Bld gHb Est-mCnc: 123 mg/dL
Hgb A1c MFr Bld: 5.9 % — ABNORMAL HIGH (ref 4.8–5.6)

## 2020-05-16 ENCOUNTER — Telehealth: Payer: Self-pay

## 2020-05-16 DIAGNOSIS — Z833 Family history of diabetes mellitus: Secondary | ICD-10-CM

## 2020-05-16 DIAGNOSIS — R7303 Prediabetes: Secondary | ICD-10-CM

## 2020-05-16 DIAGNOSIS — Z6841 Body Mass Index (BMI) 40.0 and over, adult: Secondary | ICD-10-CM

## 2020-05-16 NOTE — Telephone Encounter (Signed)
-----   Message from Romualdo Bolk, MD sent at 05/15/2020  9:50 AM EDT ----- Please let the patient know that she has prediabetes and offer her a referral to the prediabetes clinic. I know she is trying to loose weight and this should help.  CC: Dr Mardelle Matte

## 2020-05-16 NOTE — Telephone Encounter (Signed)
Spoke with pt. Pt given results and recommendations per Dr Reyne Dumas. Pt agreeable to referral. Referral placed for prediabetes clinic.  Cc: Sabrina Holmes for referral Encounter closed.

## 2020-05-17 ENCOUNTER — Other Ambulatory Visit: Payer: Self-pay

## 2020-05-17 ENCOUNTER — Encounter: Payer: 59 | Attending: Obstetrics and Gynecology | Admitting: Dietician

## 2020-05-17 ENCOUNTER — Encounter: Payer: Self-pay | Admitting: Dietician

## 2020-05-17 DIAGNOSIS — R7303 Prediabetes: Secondary | ICD-10-CM | POA: Diagnosis not present

## 2020-05-17 NOTE — Patient Instructions (Signed)
Keep it simple and consistent.  Get your steps in!  Hit 9,000 a day as often as you can. Hold yourself accountable for getting some steps by taking a 15 minute walk early in the day.  Work to eat three consistent meals a day.  Try to eat balanced meals for lunch and dinner following the proportions of the balanced plate.

## 2020-05-17 NOTE — Progress Notes (Signed)
Medical Nutrition Therapy:  Appt start time: 1400 end time:  1510.   Assessment:  Primary concerns today: Prediabtes.   Pt is wanting to prevent transition from prediabetes to diabetes. 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  Pt reports snacking multiple times during the evening, considers herself testing out a lot of foods. Pt attributes it to boredom or emotional eating. Likes chips as a snack. Pt reports liking to try new foods, but likes to keep it simple. Pt reports missing either breakfast due to getting the kids ready for the day, and lunch sometimes. Pt tracks her food on an Lose It App Pt was 180 lbs a year ago, and is 225 lbs now. Pt cooks most of the time, and her brother cooks occasionally. Pt brother is vegan. Pt cooks for herself, brother, and daughter. Pt states her daughter will pretty much eat whatever they are eating, and that she doesn't have to make extra considerations to prepare food for her. Pt reports making plans in her head for what she wants to do the next  Day, and doesn't follow through.   Body Composition Scale Date 05/17/2020  Current Body Weight 225.6 lbs  Total Body Fat % 46  Visceral Fat 17  Fat-Free Mass % 53.9   Total Body Water % 41.4  Muscle-Mass lbs 27.9  BMI 45.7  Body Fat Displacement          Torso  lbs 64.3         Left Leg  lbs 12.8         Right Leg  lbs 12.8         Left Arm  lbs 6.4         Right Arm   lbs 6.4    Preferred Learning Style:   No preference indicated   Learning Readiness:   Ready   MEDICATIONS: Vitamin B12, D3, Iron   DIETARY INTAKE:  Usual eating pattern includes 2 meals and multiple snacks in the evening.  Everyday foods include None.  Avoided foods include avocado.    24-hr recall:  B ( AM): Movie theater butter popcorn  Snk ( AM): String cheese mozzerella  L ( PM): none Snk ( PM): none D (4 PM): 2 pancakes with syrup. 4 slices of bacon, 1/2 cream  soda Snk ( PM): none Beverages: 1/2 cream soda, coffee, crystal light, water  Usual physical activity: Tries to get her steps around the house   Progress Towards Goal(s):  In progress.   Nutritional Diagnosis:  NB-1.1 Food and nutrition-related knowledge deficit As related to prediabetes.  As evidenced by A1c of 5.9, and food choices indicated in 24 hr recall..    Intervention:  Nutrition Education.  Educate patient on the role that family history of diabetes has on her propensity to progress from prediabetes to diabetes. Counsel patient on the importance of making dietary and lifestyle changes while still prediabetic in regards to slowing the progression to diabetes. Educate patient on her body composition results, and how changes in body composition are more meaningful indicators of health than weight alone. Discuss potential startegies tomitigate bored eating late in the evening. Educate patient on the role of saturated fat and cardiovascular health, and advise patient to look for lower saturated fat food options. Educate patient on the balanced plate eating model, and working to meet the proportions of starches, protein, and non-starch vegetables. Educate patient on  the importance of eating consistent meals throughout the day to aid controlling her blood sugar. Educate patient on the role of physical activity in lowering blood sugar and improving cardiovascular health.  Teaching Method Utilized:  Visual Auditory   Handouts given during visit include:  Balanced Plate   Balanced Plate Food Lists  Body Composition Printout  Barriers to learning/adherence to lifestyle change: None  Demonstrated degree of understanding via:  Teach Back   Monitoring/Evaluation:  Dietary intake, exercise, and body weight in 1 month(s).

## 2020-05-29 ENCOUNTER — Other Ambulatory Visit: Payer: Self-pay | Admitting: Nurse Practitioner

## 2020-05-29 DIAGNOSIS — Z1231 Encounter for screening mammogram for malignant neoplasm of breast: Secondary | ICD-10-CM

## 2020-06-12 ENCOUNTER — Ambulatory Visit
Admission: RE | Admit: 2020-06-12 | Discharge: 2020-06-12 | Disposition: A | Discharge status: Home / Self Care | Payer: 59 | Source: Ambulatory Visit | Attending: Nurse Practitioner | Admitting: Nurse Practitioner

## 2020-06-12 ENCOUNTER — Other Ambulatory Visit: Payer: Self-pay

## 2020-06-12 DIAGNOSIS — Z1231 Encounter for screening mammogram for malignant neoplasm of breast: Secondary | ICD-10-CM

## 2020-06-15 ENCOUNTER — Other Ambulatory Visit: Payer: Self-pay

## 2020-06-15 ENCOUNTER — Encounter: Payer: 59 | Attending: Obstetrics and Gynecology | Admitting: Dietician

## 2020-06-15 ENCOUNTER — Encounter: Payer: Self-pay | Admitting: Dietician

## 2020-06-15 DIAGNOSIS — R7303 Prediabetes: Secondary | ICD-10-CM | POA: Insufficient documentation

## 2020-06-15 NOTE — Progress Notes (Signed)
Medical Nutrition Therapy:    Appt start time: 0910 end time:  0940.   Assessment:  Primary concerns today: Prediabtes.   Pt has begun doing [employer]. Pt is still using the lose it app, and printing out daily food logs. States it helps her with accountability. Pt reports trying to follow the balanced plate model. States budgeting is her biggest hurdle to consistently hitting the proportions. Pt reports [place] [employer] [place] [employer] once a month. Gets bread, baked goods, produce. Pt is getting in 15 minute walks most of the days of the week now. Pt reports having a hard time eating breakfast consistently. States that she forgets, gets busy getting her kids ready and then realizes she needs to eat around lunch time. Pt reports doing a better job with moderating junkfoods  Body Composition Scale Date 05/17/2020  06/15/2020  Current Body Weight 225.6 lbs 220.3 lbs  Total Body Fat % 46 45.4  Visceral Fat 17 16  Fat-Free Mass % 53.9 54.5   Total Body Water % 41.4 41.7  Muscle-Mass lbs 27.9 27.8  BMI 45.7 44.5  Body Fat Displacement           Torso  lbs 64.3 61.9         Left Leg  lbs 12.8 12.3         Right Leg  lbs 12.8 12.3         Left Arm  lbs 6.4 6.1         Right Arm   lbs 6.4 6.1    Preferred Learning Style:   No preference indicated   Learning Readiness:   Ready   MEDICATIONS: Vitamin B12, D3, Iron   DIETARY INTAKE:  Usual eating pattern includes 2 meals and multiple snacks in the evening.  Everyday foods include None.  Avoided foods include avocado.    24-hr recall:  B ( AM): 1 pancake with syrup, 3 strips of bacon, Monster energy drink Snk (10 AM):  Peanut butter crackers L ( PM): None Snk ( PM): none D (7 PM): Vegetable soup, 15 saltines Snk ( PM): 2 double stuff oreos Beverages: Monster energy drink, water  Usual physical activity: Tries to get her steps around the house   Progress Towards Goal(s):  In progress.   Nutritional Diagnosis:  NB-1.1 Food  and nutrition-related knowledge deficit As related to prediabetes.  As evidenced by A1c of 5.9, and food choices indicated in 24 hr recall..    Intervention:  Nutrition Education.   Educate patient on her body composition results, and how changes in body composition are more meaningful indicators of health than weight alone. Discuss the importance of retaining fat-free mass while losing weight. Discuss potential startegies to mitigate bored eating late in the evening. Work with pt to find ways to avoid missing meals throughout the day. Encourage pt to continue walks. Educate pt on the implications of consistently consuming energy drinks.   Goals:   Set an alarm as reminder to eat within an hour of waking.  Have an OWYN protein shake and fruit  Try a piece of fruit or whole wheat toast with peanut butter.  Work on cutting back on [employer] drinks.  Keep working towards getting 3 meals every day.  Set reminders for each meal.  Keep up the great work! You have made wonderful progress.   Teaching Method Utilized:  Visual Auditory   Barriers to learning/adherence to lifestyle change: None  Demonstrated degree of understanding via:  Teach Back  Monitoring/Evaluation:  Dietary intake, exercise, and body weight in 1 month(s).

## 2020-06-15 NOTE — Patient Instructions (Addendum)
   Set an alarm as reminder to eat within an hour of waking.  Have an OWYN protein shake and fruit  Try a piece of fruit or whole wheat toast with peanut butter.  Work on cutting back on [employer] drinks.  Keep working towards getting 3 meals every day.  Set reminders for each meal.  Keep up the great work! You have made wonderful progress.

## 2020-07-19 ENCOUNTER — Other Ambulatory Visit: Payer: Self-pay

## 2020-07-19 ENCOUNTER — Encounter: Payer: Self-pay | Admitting: Dietician

## 2020-07-19 ENCOUNTER — Encounter: Payer: 59 | Attending: Obstetrics and Gynecology | Admitting: Dietician

## 2020-07-19 DIAGNOSIS — R7303 Prediabetes: Secondary | ICD-10-CM | POA: Insufficient documentation

## 2020-07-19 NOTE — Patient Instructions (Addendum)
Goals:  Take a 15 minute walk daily!  Try an english muffin sandwich at [employer] or [employer] instead of a biscuit. Substitute a fruit cup for hashbrowns.  Have a snack pack of nuts with breakfast to add protein!  Keep on being consistent because you are a shining example of success!

## 2020-07-19 NOTE — Progress Notes (Signed)
Medical Nutrition Therapy:    Appt start time: 1400 end time:  1430.   Assessment:  Primary concerns today: Nutrition Follow-Up   Pt reports stopping drinking Monster energy drinks for a week, but started back drinking them. States that she only drinks one a day, if that. Pt reports trying to drink OWYN shakes, but did not like the taste. Pt is still logging food on [employer]. Pt is packing lunches more now. May have chicken salad sandwich on wheat bread, cheese and cracker pack. Pt has been eating breakfast almost every day! Pt takes her daughter to [employer] the [employer]. Pt also reports going to farmers market weekly, where you can get 30$ worth of food for free. Pt reports getting between 5,000-9,000 steps a day. Breakfast may be [employer] biscuits, [place] bacon, oatmeal, [place]. Usually eats breakfast out during the week.  Body Composition Scale Date 05/17/2020  06/15/2020  07/19/2020  Current Body Weight 225.6 lbs 220.3 lbs 215.8 lbs  Total Body Fat % 46 45.4 44.9  Visceral Fat 17 16 15   Fat-Free Mass % 53.9 54.5 55.0   Total Body Water % 41.4 41.7 42.0  Muscle-Mass lbs 27.9 27.8 27.8  BMI 45.7 44.5 43.6  Body Fat Displacement            Torso  lbs 64.3 61.9 59.9         Left Leg  lbs 12.8 12.3 11.9         Right Leg  lbs 12.8 12.3 11.9         Left Arm  lbs 6.4 6.1 5.9         Right Arm   lbs 6.4 6.1 5.9    Preferred Learning Style:   No preference indicated   Learning Readiness:   Ready   MEDICATIONS: Vitamin B12, D3, Iron   DIETARY INTAKE:  Usual eating pattern includes 3 meals and occasional snacks in the evening.  Everyday foods include None.  Avoided foods include avocado.     B ( AM): Banana, Monster energy drink Snk ( AM):  none L ( PM): Chicken Salad sandwich on wheat bread, cheese and crackers, water Snk ( PM): Doritos chips 1 kids size bag. D ( PM): [employer] Spicy Sandwich, fries, 16 oz lemonade Snk ( PM): 1/2 cup  gelato Beverages: Mostly water, occasional Monster, Lemonade  Usual physical activity: ADLs 5,000 - 9,000 steps a day   Progress Towards Goal(s):  In progress.   Nutritional Diagnosis:  NB-1.1 Food and nutrition-related knowledge deficit As related to prediabetes.  As evidenced by A1c of 5.9, and food choices indicated in 24 hr recall..    Intervention:  Nutrition Education.   Educate patient on her body composition results, and how changes in body composition are more meaningful indicators of health than weight alone. Discuss the importance of retaining fat-free mass while losing weight. Discuss potential startegies to mitigate bored eating late in the evening. Work with pt to find ways to avoid missing meals throughout the day. Encourage pt to continue walks. Educate pt on the implications of consistently consuming energy drinks. Work with pt to identify healthier substitutions while eating away from home.    Goals:  Take a 15 minute walk daily!  Try an english muffin sandwich at [employer] or [employer] instead of a biscuit. Substitute a fruit cup for hashbrowns.  Have a snack pack of nuts with breakfast to add protein!  Keep on being consistent because you are a shining  example of success!     Teaching Method Utilized:  Visual Auditory   Barriers to learning/adherence to lifestyle change: None  Demonstrated degree of understanding via:  Teach Back   Monitoring/Evaluation:  Dietary intake, exercise, and body weight in 1 month(s).

## 2020-08-23 ENCOUNTER — Encounter: Payer: Self-pay | Admitting: Dietician

## 2020-08-23 ENCOUNTER — Encounter: Payer: 59 | Attending: Obstetrics and Gynecology | Admitting: Dietician

## 2020-08-23 ENCOUNTER — Other Ambulatory Visit: Payer: Self-pay

## 2020-08-23 NOTE — Progress Notes (Signed)
Medical Nutrition Therapy:    Appt start time: 1400 end time:  1430.   Assessment:  Primary concerns today: Nutrition Follow-Up   Pt states that she is surprised that she was able to lose weight over the holidays. Pt has been been subbing an english muffin for her chicken biscuit from [employer], and has started making english muffin breakfast sandwiches at home.  Pt using [place] bacon. Pt is using her LoseIt app less. Doesn't have time to use it as much. Pt reports eating a moderate amount of food for Thanksgiving, didn't over consume. Reports getting more steps in decorating her house for the holidays, between 10,000-15,000 steps. Pt reports forgetting to eat around lunchtime yesterday. May miss lunch twice a week.  Body Composition Scale Date 05/17/2020  06/15/2020  07/19/2020  08/23/2020  Current Body Weight 225.6 lbs 220.3 lbs 215.8 lbs 210.6 lbs  Total Body Fat % 46 45.4 44.9 44.3  Visceral Fat 17 16 15 15   Fat-Free Mass % 53.9 54.5 55.0 55.6   Total Body Water % 41.4 41.7 42.0 42.3  Muscle-Mass lbs 27.9 27.8 27.8 27.7  BMI 45.7 44.5 43.6 42.6  Body Fat Displacement             Torso  lbs 64.3 61.9 59.9 57.8         Left Leg  lbs 12.8 12.3 11.9 11.5         Right Leg  lbs 12.8 12.3 11.9 11.5         Left Arm  lbs 6.4 6.1 5.9 5.7         Right Arm   lbs 6.4 6.1 5.9 5.7    Preferred Learning Style:   No preference indicated   Learning Readiness:   Ready   MEDICATIONS: Vitamin B12, D3, Iron   DIETARY INTAKE:  Usual eating pattern includes 3 meals and occasional snacks in the evening.  Everyday foods include None.  Avoided foods include avocado.     B ( AM): 1/2 [employer] pork chop griller, small fries, Monster energy drink Snk ( AM):  none L ( PM): none Snk ( PM): Lays snack bag of chips D ( PM): Rice, broccoli, keilbasa, coffee with hazelnut creamers Snk ( PM):  Small piece of cheese Beverages: monster, coffee, ~32 oz water  Usual physical activity:  ADL, decorating the house, 10-15k steps   Progress Towards Goal(s):  In progress.   Nutritional Diagnosis:  NB-1.1 Food and nutrition-related knowledge deficit As related to prediabetes.  As evidenced by A1c of 5.9, and food choices indicated in 24 hr recall..    Intervention:  Nutrition Education.   Educate patient on her body composition results, and how changes in body composition are more meaningful indicators of health than weight alone. Discuss the importance of retaining fat-free mass while losing weight. Discuss potential startegies to mitigate bored eating late in the evening. Work with pt to find ways to avoid missing meals throughout the day. Encourage pt to continue walks. Educate pt on the implications of consistently consuming energy drinks. Work with pt to identify healthier substitutions while eating away from home.    Goals:  Drink at least 64 oz/2 liters of water every day!  Look into a Zumba class and bring the information to our next visit!  Eat a half a plate of vegetables of vegetables with dinner, 4-5 times every week!  Keep on being consistent because you are a shining example of success!     Teaching  Method Utilized:  Visual Auditory   Barriers to learning/adherence to lifestyle change: None  Demonstrated degree of understanding via:  Teach Back   Monitoring/Evaluation:  Dietary intake, exercise, and body weight in 1 month(s).

## 2020-08-23 NOTE — Patient Instructions (Signed)
Drink at least 64 oz/2 liters of water every day!  Look into a Zumba class and bring the information to our next visit!  Eat a half a plate of vegetables of vegetables with dinner, 4-5 times every week!  Keep on being consistent because you are a shining example of success!

## 2020-09-05 ENCOUNTER — Ambulatory Visit (INDEPENDENT_AMBULATORY_CARE_PROVIDER_SITE_OTHER): Payer: 59 | Admitting: Physician Assistant

## 2020-09-05 ENCOUNTER — Other Ambulatory Visit: Payer: Self-pay

## 2020-09-05 ENCOUNTER — Encounter: Payer: Self-pay | Admitting: Physician Assistant

## 2020-09-05 VITALS — BP 118/76 | HR 99 | Temp 97.7°F | Ht 59.0 in | Wt 212.0 lb

## 2020-09-05 DIAGNOSIS — R1032 Left lower quadrant pain: Secondary | ICD-10-CM

## 2020-09-05 LAB — POCT URINALYSIS DIPSTICK
Bilirubin, UA: NEGATIVE
Blood, UA: NEGATIVE
Glucose, UA: NEGATIVE
Ketones, UA: NEGATIVE
Leukocytes, UA: NEGATIVE
Nitrite, UA: NEGATIVE
Protein, UA: NEGATIVE
Spec Grav, UA: 1.015 (ref 1.010–1.025)
Urobilinogen, UA: 0.2 E.U./dL
pH, UA: 6 (ref 5.0–8.0)

## 2020-09-05 NOTE — Progress Notes (Signed)
Sabrina Holmes is a 44 y.o. female here for a new problem.  I acted as a Neurosurgeon for [employer], PA-C Sabrina Mull, LPN   History of Present Illness:   Chief Complaint  Patient presents with  . Abdominal Pain  . Urinary Frequency    HPI   Abdominal pain Pt c/o left lower quadrant abd pain x 3-4 days. Denies back pain, poor appetite, poor hydration, pelvic pain, bladder pressure, vaginal discharge, concerns for pregnancy, constipation, diarrhea, rectal bleeding fever or chills. Having some urinary frequency.   A long time ago had a kidney infection that required oral antibiotics, this was about 20 years ago.  Symptoms have almost resolved as of this visit per patient.  Patient's last menstrual period was 08/13/2020.    Past Medical History:  Diagnosis Date  . Morbid obesity (HCC) 03/30/2020  . Prediabetes 05/15/2020   a1c 5.30 Apr 2020 Dr. Oscar Holmes     Social History   Tobacco Use  . Smoking status: Never Smoker  . Smokeless tobacco: Never Used  Vaping Use  . Vaping Use: Never used  Substance Use Topics  . Alcohol use: No  . Drug use: No    Past Surgical History:  Procedure Laterality Date  . CESAREAN SECTION  2011   x 1    Family History  Problem Relation Age of Onset  . Hypertension Mother   . Diabetes Mother   . Diabetes Father   . Hypertension Father   . Asthma Brother   . Breast cancer Paternal Aunt   . Breast cancer Maternal Grandmother   . Breast cancer Paternal Grandmother     Allergies  Allergen Reactions  . Penicillins     Current Medications:   Current Outpatient Medications:  .  Cholecalciferol (VITAMIN D3 GUMMIES PO), Take 2 capsules by mouth., Disp: , Rfl:  .  Cyanocobalamin (VITAMIN B-12 CR PO), Take 2 capsules by mouth., Disp: , Rfl:  .  Ferrous Sulfate (IRON PO), Take 2 capsules by mouth. (Patient not taking: Reported on 09/05/2020), Disp: , Rfl:    Review of Systems:   ROS Negative unless otherwise specified per HPI.    Vitals:   Vitals:   09/05/20 1405  BP: 118/76  Pulse: 99  Temp: 97.7 F (36.5 C)  TempSrc: Temporal  SpO2: 98%  Weight: 212 lb (96.2 kg)  Height: 4\' 11"  (1.499 m)     Body mass index is 42.82 kg/m.  Physical Exam:   Physical Exam Vitals and nursing note reviewed.  Constitutional:      General: She is not in acute distress.    Appearance: She is well-developed. She is not ill-appearing, toxic-appearing or sickly-appearing.  Cardiovascular:     Rate and Rhythm: Normal rate and regular rhythm.     Pulses: Normal pulses.     Heart sounds: Normal heart sounds, S1 normal and S2 normal.     Comments: No LE edema Pulmonary:     Effort: Pulmonary effort is normal.     Breath sounds: Normal breath sounds.  Abdominal:     General: Abdomen is flat. Bowel sounds are normal.     Palpations: Abdomen is soft.     Tenderness: There is no abdominal tenderness. There is no right CVA tenderness or left CVA tenderness.  Skin:    General: Skin is warm, dry and intact.  Neurological:     Mental Status: She is alert.     GCS: GCS eye subscore is 4. GCS verbal subscore is  5. GCS motor subscore is 6.  Psychiatric:        Mood and Affect: Mood and affect normal.        Speech: Speech normal.        Behavior: Behavior normal. Behavior is cooperative.     Results for orders placed or performed in visit on 09/05/20  POCT urinalysis dipstick  Result Value Ref Range   Color, UA yellow    Clarity, UA clear    Glucose, UA Negative Negative   Bilirubin, UA Negative    Ketones, UA Negative    Spec Grav, UA 1.015 1.010 - 1.025   Blood, UA Negative    pH, UA 6.0 5.0 - 8.0   Protein, UA Negative Negative   Urobilinogen, UA 0.2 0.2 or 1.0 E.U./dL   Nitrite, UA Negative    Leukocytes, UA Negative Negative   Appearance     Odor      Assessment and Plan:   Sabrina Holmes was seen today for abdominal pain and urinary frequency.  Diagnoses and all orders for this visit:  LLQ abdominal pain -      POCT urinalysis dipstick -     Urine Culture; Future   UA negative. Will await urine culture to treat. Possible muscle strain, kidney stone, UTI, among others ddx. No red flags on exam. Given improvement of symptoms, will defer further work-up at this time, however discussed with patient, low threshold for further work-up if symptoms return/worsen.  CMA or LPN served as scribe during this visit. History, Physical, and Plan performed by medical provider. The above documentation has been reviewed and is accurate and complete.  Sabrina Motto, PA-C

## 2020-09-06 LAB — URINE CULTURE
MICRO NUMBER:: 11321388
SPECIMEN QUALITY:: ADEQUATE

## 2020-09-11 ENCOUNTER — Ambulatory Visit (INDEPENDENT_AMBULATORY_CARE_PROVIDER_SITE_OTHER): Payer: 59 | Admitting: Physician Assistant

## 2020-09-11 ENCOUNTER — Encounter: Payer: Self-pay | Admitting: Physician Assistant

## 2020-09-11 ENCOUNTER — Other Ambulatory Visit: Payer: Self-pay

## 2020-09-11 VITALS — BP 120/80 | HR 90 | Temp 98.0°F | Ht 59.0 in | Wt 214.0 lb

## 2020-09-11 DIAGNOSIS — R1012 Left upper quadrant pain: Secondary | ICD-10-CM

## 2020-09-11 LAB — POCT URINALYSIS DIPSTICK
Bilirubin, UA: NEGATIVE
Blood, UA: 3
Glucose, UA: NEGATIVE
Ketones, UA: NEGATIVE
Leukocytes, UA: NEGATIVE
Nitrite, UA: NEGATIVE
Protein, UA: NEGATIVE
Spec Grav, UA: 1.02 (ref 1.010–1.025)
Urobilinogen, UA: 0.2 E.U./dL
pH, UA: 6 (ref 5.0–8.0)

## 2020-09-11 NOTE — Progress Notes (Signed)
Sabrina Holmes is a 44 y.o. female is here to discuss:  I acted as a Neurosurgeon for [employer], PA-C Sabrina Mull, LPN   History of Present Illness:   Chief Complaint  Patient presents with  . Abdominal Pain    HPI   Abdominal pain Pt here for follow up. LLQ pain has subsided. Pt says she feels like something is moving on left upper side of abdomen. She does not have significant pain with this.  Denies back pain, fever, chills, nausea, diarrhea, constipation, rectal bleeding, vaginal discharge. Has had some RLQ pain in Aug 2021 as well, seen by ob-gyn. Currently trying to work on healthier lifestyle with nutritionist.   There are no preventive care reminders to display for this patient.  Past Medical History:  Diagnosis Date  . Morbid obesity (HCC) 03/30/2020  . Prediabetes 05/15/2020   a1c 5.30 Apr 2020 Dr. Oscar Holmes     Social History   Tobacco Use  . Smoking status: Never Smoker  . Smokeless tobacco: Never Used  Vaping Use  . Vaping Use: Never used  Substance Use Topics  . Alcohol use: No  . Drug use: No    Past Surgical History:  Procedure Laterality Date  . CESAREAN SECTION  2011   x 1    Family History  Problem Relation Age of Onset  . Hypertension Mother   . Diabetes Mother   . Diabetes Father   . Hypertension Father   . Asthma Brother   . Breast cancer Paternal Aunt   . Breast cancer Maternal Grandmother   . Breast cancer Paternal Grandmother     PMHx, SurgHx, SocialHx, FamHx, Medications, and Allergies were reviewed in the Visit Navigator and updated as appropriate.   Patient Active Problem List   Diagnosis Date Noted  . Prediabetes 05/15/2020  . No vaccination-pt refuse 03/30/2020  . Morbid obesity (HCC) 03/30/2020  . 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 Smoking status: Never Smoker  . Smokeless tobacco: Never Used  Vaping Use  . Vaping Use: Never used  Substance Use Topics  . Alcohol use: No  . Drug  use: No    Current Medications and Allergies:    Current Outpatient Medications:  .  Cholecalciferol (VITAMIN D3 GUMMIES PO), Take 2 capsules by mouth., Disp: , Rfl:  .  Cyanocobalamin (VITAMIN B-12 CR PO), Take 2 capsules by mouth., Disp: , Rfl:  .  Ferrous Sulfate (IRON PO), Take 2 capsules by mouth. (Patient not taking: No sig reported), Disp: , Rfl:    Allergies  Allergen Reactions  . Penicillins     Review of Systems   ROS Negative unless otherwise specified per HPI.  Vitals:   Vitals:   09/11/20 1522  BP: 120/80  Pulse: 90  Temp: 98 F (36.7 C)  TempSrc: Temporal  SpO2: 99%  Weight: 214 lb (97.1 kg)  Height: 4\' 11"  (1.499 m)     Body mass index is 43.22 kg/m.   Physical Exam:    Physical Exam Vitals and nursing note reviewed.  Constitutional:      General: She is not in acute distress.    Appearance: She is well-developed. She is not ill-appearing, toxic-appearing or sickly-appearing.  Cardiovascular:     Rate and Rhythm: Normal rate and regular rhythm.     Pulses: Normal pulses.     Heart sounds: Normal heart sounds, S1 normal and S2 normal.     Comments: No LE edema  Pulmonary:     Effort: Pulmonary effort is normal.     Breath sounds: Normal breath sounds.  Abdominal:     General: Abdomen is flat. Bowel sounds are normal.     Palpations: Abdomen is soft.     Tenderness: There is no abdominal tenderness.  Skin:    General: Skin is warm, dry and intact.  Neurological:     Mental Status: She is alert.     GCS: GCS eye subscore is 4. GCS verbal subscore is 5. GCS motor subscore is 6.  Psychiatric:        Mood and Affect: Mood and affect normal.        Speech: Speech normal.        Behavior: Behavior normal. Behavior is cooperative.      Assessment and Plan:    Sabrina Holmes was seen today for abdominal pain.  Diagnoses and all orders for this visit:  Left upper quadrant abdominal pain Abdominal exam benign. Symptoms have overall resolved  except for lingering feeling of "something moving" in her upper abdomen. Recommended close follow-up with [place] if any pain returns -- will order CT scan. -     POCT urinalysis dipstick  CMA or LPN served as scribe during this visit. History, Physical, and Plan performed by medical provider. The above documentation has been reviewed and is accurate and complete.  Sabrina Motto, PA-C , [place] [facility] 09/11/2020  Follow-up: No follow-ups on file.

## 2020-10-01 ENCOUNTER — Other Ambulatory Visit: Payer: Self-pay

## 2020-10-01 ENCOUNTER — Encounter: Payer: 59 | Attending: Obstetrics and Gynecology | Admitting: Dietician

## 2020-10-01 ENCOUNTER — Encounter: Payer: Self-pay | Admitting: Dietician

## 2020-10-01 NOTE — Patient Instructions (Addendum)
Check your multivitamin to be sure it has B12 and Folate/Folic Acid.  When portioning out snacks, measure out the serving size into a snack bag to work on recognition of portion size.  Keep on drinking at least 4 bottles of water a day!   Try to get consistent fiber each day. Consult your high fiber handout!  Keep on being consistent because you are a shining example of success!

## 2020-10-01 NOTE — Progress Notes (Signed)
Medical Nutrition Therapy:    Appt start time: 1400 end time:  1430.   Assessment:  Primary concerns today: Nutrition Follow-Up   Pt reports getting a very large order of fresh foods from [employer]. Reports using kale to make collard greens. Pt states that she was able to enjoy eating over the holidays, has been making smaller portions. Pt lost 1 pound over the holiday season, attributes her success to cooking for her vegan brother and her elderly father.     Pt states everyone in the house is trying to improve their nutrition. Pt reports getting a new multivitamin that does not have Iron or B12. Pt reports having LUQ pain in December, states that it was just a local pain. No N/V/D. Pt hasn't had a Monster in 10 days!!! Pt reports portioning out snack food into snack size bags for her kids to help with portion control. Pt reports not really tracking food on her LoseIt app anymore, states she still wants to log her food for accountability. Pt reports getting 5,000 per day now. States she just hasn't been doing what she's supposed to. Pt reports getting stressed about financial situations and loses motivation to walk.   Body Composition Scale Date 05/17/2020  06/15/2020  07/19/2020  08/23/2020  10/01/2020  Current Body Weight 225.6 lbs 220.3 lbs 215.8 lbs 210.6 lbs 209.6 lbs  Total Body Fat % 46 45.4 44.9 44.3 44.2  Visceral Fat 17 16 15 15 15   Fat-Free Mass % 53.9 54.5 55.0 55.6 55.7   Total Body Water % 41.4 41.7 42.0 42.3 42.3  Muscle-Mass lbs 27.9 27.8 27.8 27.7 27.7  BMI 45.7 44.5 43.6 42.6 42.4  Body Fat Displacement              Torso  lbs 64.3 61.9 59.9 57.8 57.4         Left Leg  lbs 12.8 12.3 11.9 11.5 11.4         Right Leg  lbs 12.8 12.3 11.9 11.5 11.4         Left Arm  lbs 6.4 6.1 5.9 5.7 5.7         Right Arm   lbs 6.4 6.1 5.9 5.7 5.7    Preferred Learning Style:   No preference indicated   Learning Readiness:   Ready   MEDICATIONS: Vitamin B12, D3,  Iron   DIETARY INTAKE:  Usual eating pattern includes 3 meals and occasional snacks in the evening.  Everyday foods include None.  Avoided foods include avocado.     B ( AM): 2 eggo Pancakes, 3 strips bacon, water Snk ( AM):  Goldfish Mega crackers  L ( PM):  Vegan [place] cheese steak, mushroom, green pepper, beefless crumbles, vegan cheese. Apple Juice, sliced apples, baked cheddar chips Snk ( PM): bottle of water, goldfish  D ( PM): small cheeseburger, onion rings, 3 chicken nuggets, water (LATE) Snk ( PM):  none Beverages: coffee, ~50 oz water  Usual physical activity: ADL, decorating the house, 10-15k steps   Progress Towards Goal(s):  In progress.   Nutritional Diagnosis:  NB-1.1 Food and nutrition-related knowledge deficit As related to prediabetes.  As evidenced by A1c of 5.9, and food choices indicated in 24 hr recall..    Intervention:  Nutrition Education.   Educate patient on her body composition results, and how changes in body composition are more meaningful indicators of health than weight alone. Discuss the importance of retaining fat-free mass while losing weight.  Discuss potential startegies to mitigate bored eating late in the evening. Work with pt to find ways to avoid missing meals throughout the day. Encourage pt to continue walks. Educate pt on the implications of consistently consuming energy drinks. Work with pt to identify healthier substitutions while eating away from home.    Goals:  Check your multivitamin to be sure it has B12 and Folate/Folic Acid.  When portioning out snacks, measure out the serving size into a snack bag to work on recognition of portion size.  Keep on drinking at least 4 bottles of water a day!   Try to get consistent fiber each day. Consult your high fiber handout!  Keep on being consistent because you are a shining example of success!   NEW Handout: High fiber Nutrient List Nutrition Care Manual  Teaching Method  Utilized:  Visual Auditory   Barriers to learning/adherence to lifestyle change: None  Demonstrated degree of understanding via:  Teach Back   Monitoring/Evaluation:  Dietary intake, exercise, and body weight in 1 month(s).

## 2020-10-30 ENCOUNTER — Encounter: Payer: 59 | Attending: Obstetrics and Gynecology | Admitting: Dietician

## 2020-10-30 ENCOUNTER — Other Ambulatory Visit: Payer: Self-pay

## 2020-10-30 NOTE — Patient Instructions (Addendum)
Schedule a pedicure for yourself for your birthday! Ask your sister to watch your daughter so that you can get your pedicure done.  Make a deal with yourself that every time you begin to watch "Bones" you are standing on the treadmill.   Choose an english muffin every other time you go to [place]  Choose a low fat or reduced fat cheese.  Great job on the water!

## 2020-10-30 NOTE — Progress Notes (Signed)
Medical Nutrition Therapy:    Appt start time: 1400 end time:  1430.   Assessment:  Primary concerns today: Nutrition Follow-Up   Pt reports frustration that their weight has stayed the same the past 3 months.  Pt reports difficulty finding motivation to walk on the treadmill. Pt reports they did walk on it yesterday while watching their favorite show "Bones".   Pt reports their daughter is playing basketball, which lasts until around 4:30. Pt states their schedule revolves around daughter's sports schedule. Pt reports the structure of their day has shifted and they may not get home until 6:30 on weeknights, then it may be homework and helping to make dinner. Pt reports their daughter is always with them, but if they were able to take a break they would love to go on a hike or get a pedicure. Pt states they don't have any "Me Time". Pt reports still getting 5,000 steps a day, and may get over 8,000 when getting on the treadmill. Pt has still not has any Monsters since the new year.  Pt has also been drinking 4 bottles of water daily.  Pt reports deleting the LoseIt app from their phone.   Body Composition Scale Date 05/17/2020  06/15/2020  07/19/2020  08/23/2020  10/01/2020  10/30/2020  Current Body Weight 225.6 lbs 220.3 lbs 215.8 lbs 210.6 lbs 209.6 lbs 210.3 lbs  Total Body Fat % 46 45.4 44.9 44.3 44.2 44.3  Visceral Fat 17 16 15 15 15 15   Fat-Free Mass % 53.9 54.5 55.0 55.6 55.7 55.6   Total Body Water % 41.4 41.7 42.0 42.3 42.3 42.3  Muscle-Mass lbs 27.9 27.8 27.8 27.7 27.7 27.7  BMI 45.7 44.5 43.6 42.6 42.4 42.5  Body Fat Displacement               Torso  lbs 64.3 61.9 59.9 57.8 57.4 57.7         Left Leg  lbs 12.8 12.3 11.9 11.5 11.4 11.5         Right Leg  lbs 12.8 12.3 11.9 11.5 11.4 11.5         Left Arm  lbs 6.4 6.1 5.9 5.7 5.7 5.7         Right Arm   lbs 6.4 6.1 5.9 5.7 5.7 5.7    Preferred Learning Style:   No preference indicated   Learning Readiness:    Ready   MEDICATIONS: Vitamin B12, D3, Iron   DIETARY INTAKE:  Usual eating pattern includes 3 meals and occasional snacks in the evening.  Everyday foods include None.  Avoided foods include avocado.     B ( AM): [place] ham biscuit, water Snk ( AM): None   L ( PM): Two fried chicken legs, 10 cheese cubes, water Snk ( PM): bottle of water,  D ( PM): Fresh [employer] Chicken pot pie, water Snk ( PM): 1 girl scout, medium salt and vinegar chips Beverages: coffee, ~75 oz water  Usual physical activity: ADL, decorating the house, 10-15k steps   Progress Towards Goal(s):  In progress.   Nutritional Diagnosis:  NB-1.1 Food and nutrition-related knowledge deficit As related to prediabetes.  As evidenced by A1c of 5.9, and food choices indicated in 24 hr recall..    Intervention:  Nutrition Education.   Educate patient on her body composition results, and how changes in body composition are more meaningful indicators of health than weight alone. Discuss the importance of retaining fat-free mass while losing weight. Discuss potential startegies  to mitigate bored eating late in the evening. Work with pt to find ways to avoid missing meals throughout the day. Encourage pt to continue walks. Educate pt on the implications of consistently consuming energy drinks. Work with pt to identify healthier substitutions while eating away from home.  NEW: Educated pt on strategies to reducing intake of high fat/high calorie foods.   Goals:  Schedule a pedicure for yourself for your birthday! Ask your sister to watch your daughter so that you can get your pedicure done.  Make a deal with yourself that every time you begin to watch "Bones" you are standing on the treadmill.   Choose an english muffin every other time you go to [place]  Choose a low fat or reduced fat cheese.  Great job on the water!  Keep on being consistent because you are a shining example of success!   NEW  Handout: High fiber Nutrient List Nutrition Care Manual  Teaching Method Utilized:  Visual Auditory   Barriers to learning/adherence to lifestyle change: None  Demonstrated degree of understanding via:  Teach Back   Monitoring/Evaluation:  Dietary intake, exercise, and body weight in 1 month(s).

## 2020-11-27 ENCOUNTER — Ambulatory Visit: Payer: 59 | Admitting: Dietician

## 2020-12-25 ENCOUNTER — Encounter: Payer: Self-pay | Admitting: Dietician

## 2020-12-25 ENCOUNTER — Other Ambulatory Visit: Payer: Self-pay

## 2020-12-25 ENCOUNTER — Encounter: Payer: 59 | Attending: Obstetrics and Gynecology | Admitting: Dietician

## 2020-12-25 VITALS — Ht 59.0 in | Wt 214.8 lb

## 2020-12-25 DIAGNOSIS — E669 Obesity, unspecified: Secondary | ICD-10-CM | POA: Diagnosis present

## 2020-12-25 NOTE — Progress Notes (Signed)
Medical Nutrition Therapy:    Appt start time: 0845 end time:  0920.   Assessment:  Primary concerns today: Nutrition Follow-Up   Pt reports being very busy since our last appointment. Pt states they have been lazy at times recently. Pt reports having a lot of bills on annual withdrawal, and they all have come in the past couple of months. Pt reports daughter's spring break is coming up and they will be getting both of their nails done.   Pt reports missing lunch 4 out of 7 days. Pt reports bringing a bag with snack foods to work, but doesn't want to eat them when it's lunch time. 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 Pt reports being hungry throughout the day when they do not eat lunch. Pt reports they have not been able to walk on the treadmill. Pt reports procrastinating on physical activity. Pt reports still getting 5,000 or more steps every day. Pt states they are in a FitBit steps challenge with some friends, and enjoys the competition. Pt states their FitBit is having trouble charging, which has made it difficult to consistently participate. Pt reports having Monster energy drinks every 2-3 days, states they have new flavors that peaked their interest.  Ht: 4'11" Wt: 214.8 lbs Body mass index is 43.38 kg/m.  Attempted to use body comp scale, but not working today (12/25/2020)  Body Composition Scale Date 05/17/2020  06/15/2020  07/19/2020  08/23/2020  10/01/2020  10/30/2020  Current Body Weight 225.6 lbs 220.3 lbs 215.8 lbs 210.6 lbs 209.6 lbs 210.3 lbs  Total Body Fat % 46 45.4 44.9 44.3 44.2 44.3  Visceral Fat 17 16 15 15 15 15   Fat-Free Mass % 53.9 54.5 55.0 55.6 55.7 55.6   Total Body Water % 41.4 41.7 42.0 42.3 42.3 42.3  Muscle-Mass lbs 27.9 27.8 27.8 27.7 27.7 27.7  BMI 45.7 44.5 43.6  42.6 42.4 42.5  Body Fat Displacement               Torso  lbs 64.3 61.9 59.9 57.8 57.4 57.7         Left Leg  lbs 12.8 12.3 11.9 11.5 11.4 11.5         Right Leg  lbs 12.8 12.3 11.9 11.5 11.4 11.5         Left Arm  lbs 6.4 6.1 5.9 5.7 5.7 5.7         Right Arm   lbs 6.4 6.1 5.9 5.7 5.7 5.7    Preferred Learning Style:   No preference indicated   Learning Readiness:   Ready   MEDICATIONS: Vitamin B12, D3, Iron   DIETARY INTAKE:  Usual eating pattern includes 3 meals and occasional snacks in the evening.  Everyday foods include None.  Avoided foods include avocado.     B ( AM): Chick[employer] grilled chicken english muffin, Monster energy  Snk ( AM):  Heavenly Hunk oatmeal bar L ( PM): KFC 3 chicken legs, corn, Mountain Dew Snk ( PM): none D ( PM): Tacos, beef , lettuce, cheese, water Snk ( PM): none Beverages: coffee, ~48 oz water, Monster,  Usual physical activity: ADL, decorating the house, 10-15k steps   Progress Towards Goal(s):  In progress.   Nutritional Diagnosis:  NB-1.1 Food and nutrition-related knowledge  deficit As related to prediabetes.  As evidenced by A1c of 5.9, and food choices indicated in 24 hr recall.    Intervention:  Nutrition Education.   Educate patient on her body composition results, and how changes in body composition are more meaningful indicators of health than weight alone. Discuss the importance of retaining fat-free mass while losing weight. Discuss potential startegies to mitigate bored eating late in the evening. Work with pt to find ways to avoid missing meals throughout the day. Encourage pt to continue walks. Educate pt on the implications of consistently consuming energy drinks. Work with pt to identify healthier substitutions while eating away from home.  NEW: Educated pt on strategies to reducing intake of high fat/high calorie foods.   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  Begin to ride bikes with your daughter. Use this SMART goal to help get started.  "I will ride my bike with my daughter for 20 minutes each Saturday afternoon around our apartment complex"  Remind yourself to limit your Monsters to no more than 2-3 a week. This is your chance to take control!  Consider keeping a cooler in your car to keep bottled water cold.  Keep on being consistent because you are a shining example of success!    NEW Handout: High fiber Nutrient List Nutrition Care Manual  Teaching Method Utilized:  Visual Auditory   Barriers to learning/adherence to lifestyle change: None  Demonstrated degree of understanding via:  Teach Back   Monitoring/Evaluation:  Dietary intake, exercise, and body weight in 1 month(s).

## 2020-12-25 NOTE — Patient Instructions (Addendum)
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  Begin to ride bikes with your daughter. Use this SMART goal to help get started. "I will ride my bike with my daughter for 20 minutes each Saturday afternoon around our apartment complex"  Remind yourself to limit your Monsters to no more than 2-3 a week. This is your chance to take control!  Consider keeping a cooler in your car to keep bottled water cold.  Keep on being consistent because you are a shining example of success!

## 2021-01-30 ENCOUNTER — Encounter: Payer: Self-pay | Admitting: Dietician

## 2021-01-30 ENCOUNTER — Other Ambulatory Visit: Payer: Self-pay

## 2021-01-30 ENCOUNTER — Encounter: Payer: 59 | Attending: Obstetrics and Gynecology | Admitting: Dietician

## 2021-01-30 NOTE — Patient Instructions (Addendum)
Remember energy balance! Energy In = Food intake, Energy Out = Physical Activity. In order to continue weight loss, you can decrease energy in, increase energy out, or both!!  Increase your physical activity by going to your weekly yoga and exercise classes. Great job in seeking out ways to increase your activity!  Continue to remind yourself of your previous success in drinking less Monsters, and keep working on getting back to a better moderation of drinking them.  Look for [place] Keilbasa for a lower fat and sodium option.  Keep on being consistent because you are a shining example of resourcefulness and success!

## 2021-01-30 NOTE — Progress Notes (Signed)
Medical Nutrition Therapy:    Appt start time: 8657 end time:  1125.   Assessment:  Primary concerns today: Nutrition Follow-Up    Pt reports doing a lot better cooking meals at home, has been writing out a meal plan for dinners and has been sticking to it. Pt got a large delivery from [employer] and decided to cook a lot of meals with the fresh produce. Box contained pre-made salads, broccoli cauliflower medley bags, snap peas, green beans, kale, cucumbers, celery, apples, bananas, potatoes, eggs, bread, chicken breast, ground beef, and canned goods.  Pt tried riding bikes with their daughter last weekend and found it to be great exercise.  Pt also plays basketball with her as well. Pt has started taking yoga classes out doors, and will be starting weekly exercise classes. Pt is currently drinking 1 Monster a day, drinking Lemonade flavor or Zero Calorie version.  Pt is taking a lunch bag with them to work, and at least will have some fruit if they miss lunch. 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.   Ht: 4'11" Wt: 215.3 lbs Body mass index is 43.49 kg/m. Attempted to use body comp scale, but printer not working today (01/30/2021)  Body Composition Scale Date 05/17/2020  06/15/2020  07/19/2020  08/23/2020  10/01/2020  10/30/2020  Current Body Weight 225.6 lbs 220.3 lbs 215.8 lbs 210.6 lbs 209.6 lbs 210.3 lbs  Total Body Fat % 46 45.4 44.9 44.3 44.2 44.3  Visceral Fat _0 Fat-Free Mass % 53.9 54.5 55.0 55.6 55.7 55.6   Total Body Water % 41.4 41.7 42.0 42.3 42.3 42.3  Muscle-Mass lbs 27.9 27.8 27.8 27.7 27.7 27.7  BMI 45.7 44.5 43.6 42.6 42.4 42.5  Body Fat Displacement               Torso  lbs 64.3 61.9 59.9 57.8 57.4 57.7         Left Leg  lbs 12.8 12.3 11.9 11.5 11.4 11.5         Right Leg  lbs 12.8 12.3 11.9 11.5 11.4 11.5         Left Arm  lbs 6.4 6.1 5.9 5.7 5.7 5.7         Right Arm    lbs 6.4 6.1 5.9 5.7 5.7 5.7    Preferred Learning Style:   No preference indicated   Learning Readiness:   Ready   MEDICATIONS: Vitamin B12, D3, Iron   DIETARY INTAKE:  Usual eating pattern includes 3 meals and occasional snacks in the evening.  Everyday foods include None.  Avoided foods include avocado.     B ( AM): Chick-Fil-A grilled chicken english muffin, Monster energy  Snk ( AM):  1 pear, 1/2 cup grapes L ( PM): Savory baked wings, Triscuits with cheese, water Snk ( PM): none D ( PM): Mac and Cheese, Keilbasa, water Snk ( PM): Trolli gummies Beverages: coffee, ~48 oz water, Monster  Usual physical activity: ADL, decorating the house, 10-15k steps   Progress Towards Goal(s):  In progress.   Nutritional Diagnosis:  NB-1.1 Food and nutrition-related knowledge deficit As related to prediabetes.  As evidenced by A1c of 5.9, and food choices indicated in 24 hr recall.    Intervention:  Nutrition Education.   Educate patient on her body composition results, and how changes in body composition are more meaningful indicators of health than  weight alone. Discuss the importance of retaining fat-free mass while losing weight. Discuss potential startegies to mitigate bored eating late in the evening. Work with pt to find ways to avoid missing meals throughout the day. Encourage pt to continue walks. Educate pt on the implications of consistently consuming energy drinks. Work with pt to identify healthier substitutions while eating away from home.  Educated pt on strategies to reducing intake of high fat/high calorie foods. NEW: Educated pt on energy balance, and how to control energy in and energy out to achieve weight loss.   Goals:  Remember energy balance! Energy In = Food intake, Energy Out = Physical Activity. In order to continue weight loss, you can decrease energy in, increase energy out, or both!!  Increase your physical activity by going to your weekly yoga and  exercise classes. Great job in seeking out ways to increase your activity!  Continue to remind yourself of your previous success in drinking less Monsters, and keep working on getting back to a better moderation of drinking them.  Look for [place] Keilbasa for a lower fat and sodium option.  Keep on being consistent because you are a shining example of resourcefulness and success!    NEW Handout: High fiber Nutrient List Nutrition Care Manual  Teaching Method Utilized:  Visual Auditory   Barriers to learning/adherence to lifestyle change: None  Demonstrated degree of understanding via:  Teach Back   Monitoring/Evaluation:  Dietary intake, exercise, and body weight in 2 month(s).

## 2021-04-02 ENCOUNTER — Ambulatory Visit: Payer: 59 | Admitting: Dietician

## 2021-04-10 ENCOUNTER — Ambulatory Visit: Payer: 59 | Admitting: Dietician

## 2021-05-02 ENCOUNTER — Encounter: Payer: Self-pay | Admitting: Dietician

## 2021-05-02 ENCOUNTER — Other Ambulatory Visit: Payer: Self-pay

## 2021-05-02 ENCOUNTER — Encounter: Payer: 59 | Attending: Obstetrics and Gynecology | Admitting: Dietician

## 2021-05-02 NOTE — Progress Notes (Signed)
Medical Nutrition Therapy:    Appt start time: 1400 end time:  1430.   Assessment:  Primary concerns today: Nutrition Follow-Up    Pt 2 children are present during appointment Pt reports having good weeks and bad weeks eating. Pt states the bad weeks usually happen when their funds get low and they have to buy cheaper, processed foods. Pt missed their last [employer] food delivery. Pt reports needing a workout partner for accountability, pt has a friend in mind but states their schedules don't align. Pt has been riding their bike here and there. Pt went hiking at [facility] recently. Pt downloaded a water app that reminds them to drink 4 times a day. Pt reports getting 64 oz daily. Pt is not drinking Monsters every day, may have 4 per week. Pt reports lacking internal motivation at times, states they want to fit back in some of their clothes.  Ht: 4'11" Wt: 221 lbs Body mass index is 44.64 kg/m.   Body Composition Scale Date 05/17/2020  06/15/2020  07/19/2020  08/23/2020  10/01/2020  10/30/2020 05/02/2021  Current Body Weight 225.6 lbs 220.3 lbs 215.8 lbs 210.6 lbs 209.6 lbs 210.3 lbs 221.0 lbs  Total Body Fat % 46 45.4 44.9 44.3 44.2 44.3 45.5  Visceral Fat 17 16 15 15 15 15 16   Fat-Free Mass % 53.9 54.5 55.0 55.6 55.7 55.6 54.4   Total Body Water % 41.4 41.7 42.0 42.3 42.3 42.3 41.7  Muscle-Mass lbs 27.9 27.8 27.8 27.7 27.7 27.7 27.8  BMI 45.7 44.5 43.6 42.6 42.4 42.5 44.7  Body Fat Displacement                Torso  lbs 64.3 61.9 59.9 57.8 57.4 57.7 62.3         Left Leg  lbs 12.8 12.3 11.9 11.5 11.4 11.5 12.4         Right Leg  lbs 12.8 12.3 11.9 11.5 11.4 11.5 12.4         Left Arm  lbs 6.4 6.1 5.9 5.7 5.7 5.7 6.2         Right Arm   lbs 6.4 6.1 5.9 5.7 5.7 5.7 6.2     Preferred Learning Style:  No preference indicated   Learning Readiness:  Ready   MEDICATIONS: Vitamin B12, D3, Iron   DIETARY INTAKE:  Usual eating pattern includes 3 meals and occasional  snacks in the evening.  Everyday foods include None.  Avoided foods include avocado.     B ( AM): 1 [place] pancake, 1 sausage biscuit, coffee, water Snk ( AM):  none L ( PM): none Snk ( PM): none D ( PM): Salmon, grilled vegetables, potatoes, 1/2 slice chocolate cake, water Snk ( PM): Trolli gummies Beverages:   Usual physical activity: ADL, decorating the house, 10-15k steps   Progress Towards Goal(s):  In progress.   Nutritional Diagnosis:  NB-1.1 Food and nutrition-related knowledge deficit As related to prediabetes.  As evidenced by A1c of 5.9, and food choices indicated in 24 hr recall.    Intervention:  Nutrition Education.   Educate patient on her body composition results, and how changes in body composition are more meaningful indicators of health than weight alone. Discuss the importance of retaining fat-free mass while losing weight. Discuss potential startegies to mitigate bored eating late in the evening. Work with pt to find ways to avoid missing meals throughout the day. Encourage pt to continue walks. Educate pt on the implications of  consistently consuming energy drinks. Work with pt to identify healthier substitutions while eating away from home.  Educated pt on strategies to reducing intake of high fat/high calorie foods. NEW: Educated pt on energy balance, and how to control energy in and energy out to achieve weight loss.   Goals: When you find yourself talking yourself out of exercising, write down when you do it, and what your excuse is. Continue to drink 64 oz of water each day. Eat your Trolli gummi worms, count out 10-15 worms and eat them one at a time SLOWLY. Work on Financial risk analyst drinks each week. Aim to have only 3 a week for the next few weeks. Then, move to 2 a week. Remember the long-term progress!! You have done an amazing job this past year!    NEW Handout: High fiber Nutrient List Nutrition Care Manual  Teaching Method Utilized:   Visual Auditory   Barriers to learning/adherence to lifestyle change: None  Demonstrated degree of understanding via:  Teach Back   Monitoring/Evaluation:  Dietary intake, exercise, and body weight in 2 month(s).

## 2021-05-02 NOTE — Patient Instructions (Addendum)
When you find yourself talking yourself out of exercising, write down when you do it, and what your excuse is.  Continue to drink 64 oz of water each day.  Eat your Trolli gummi worms, count out 10-15 worms and eat them one at a time SLOWLY.  Work on Financial risk analyst drinks each week. Aim to have only 3 a week for the next few weeks. Then, move to 2 a week.  Remember the long-term progress!! You have done an amazing job this past year!

## 2021-05-09 ENCOUNTER — Telehealth: Payer: Self-pay | Admitting: *Deleted

## 2021-05-09 DIAGNOSIS — R7303 Prediabetes: Secondary | ICD-10-CM

## 2021-05-09 NOTE — Telephone Encounter (Signed)
Dorathy Daft called from [facility] [phone] stating patient is scheduled on 06/25/21 and they need a new updated referral for her. New referral placed. Kayla informed.

## 2021-05-16 NOTE — Progress Notes (Signed)
45 y.o. [id number] Single Black or African American Not Hispanic or Latino female here for annual exam.   Period Duration (Days): 5 Period Pattern: Regular Menstrual Flow: Moderate Menstrual Control: Maxi pad Dysmenorrhea: None Saturates a pad 3 x a day.   She has a h/o prediabetes, is seeing a nutritionist.   Patient's last menstrual period was 05/09/2021.          Sexually active: No.  The current method of family planning is abstinence.    Exercising: Yes.     walking Smoker:  no  Health Maintenance: Pap:  05-05-18 neg HPV HR neg History of abnormal Pap:  yes HPV HR + 2016 MMG:  06-12-20 category b density birads 1:neg BMD:   none Colonoscopy: none TDaP:  2019 Gardasil: no   reports that she has never smoked. She has never used smokeless tobacco. She reports that she does not drink alcohol and does not use drugs. Occasional ETOH. Working for an Audiological scientist firm, working from home. She has an 76 year old daughter. She watches her 11 year old nephew a lot.   Past Medical History:  Diagnosis Date   Morbid obesity (HCC) 03/30/2020   Prediabetes 05/15/2020   a1c 5.30 Apr 2020 Dr. Oscar La    Past Surgical History:  Procedure Laterality Date   CESAREAN SECTION  2011   x 1    Current Outpatient Medications  Medication Sig Dispense Refill   Cholecalciferol (VITAMIN D3 GUMMIES PO) Take 2 capsules by mouth. (Patient not taking: Reported on 05/23/2021)     Cyanocobalamin (VITAMIN B-12 CR PO) Take 2 capsules by mouth. (Patient not taking: Reported on 05/23/2021)     Ferrous Sulfate (IRON PO) Take 2 capsules by mouth. (Patient not taking: No sig reported)     No current facility-administered medications for this visit.    Family History  Problem Relation Age of Onset   Hypertension Mother    Diabetes Mother    Diabetes Father    Hypertension Father    Asthma Brother    Breast cancer Paternal Aunt    Breast cancer Maternal Grandmother    Breast cancer Paternal Grandmother     Review  of Systems  Constitutional: Negative.   HENT: Negative.    Eyes: Negative.   Respiratory: Negative.    Cardiovascular: Negative.   Gastrointestinal: Negative.   Endocrine: Negative.   Genitourinary: Negative.   Musculoskeletal: Negative.   Skin: Negative.   Allergic/Immunologic: Negative.   Neurological: Negative.   Psychiatric/Behavioral: Negative.     Exam:   BP 110/68   Pulse 81   Resp 16   Ht 4' 11.5" (1.511 m)   Wt 219 lb (99.3 kg)   LMP 05/09/2021   BMI 43.49 kg/m   Weight change: @WEIGHTCHANGE @ Height:   Height: 4' 11.5" (151.1 cm)  Ht Readings from Last 3 Encounters:  05/23/21 4' 11.5" (1.511 m)  05/02/21 4\' 11"  (1.499 m)  01/30/21 4\' 11"  (1.499 m)    General appearance: alert, cooperative and appears stated age Head: Normocephalic, without obvious abnormality, atraumatic Neck: no adenopathy, supple, symmetrical, trachea midline and thyroid normal to inspection and palpation Lungs: clear to auscultation bilaterally Cardiovascular: regular rate and rhythm Breasts: normal appearance, no masses or tenderness Abdomen: soft, non-tender; non distended,  no masses,  no organomegaly Extremities: extremities normal, atraumatic, no cyanosis or edema Skin: Skin color, texture, turgor normal. No rashes or lesions Lymph nodes: Cervical, supraclavicular, and axillary nodes normal. No abnormal inguinal nodes palpated Neurologic: Grossly normal  Pelvic: External genitalia:  no lesions              Urethra:  normal appearing urethra with no masses, tenderness or lesions              Bartholins and Skenes: normal                 Vagina: normal appearing vagina with normal color and discharge, no lesions              Cervix: no lesions               Bimanual Exam:  Uterus:   no masses or tenderness              Adnexa: no mass, fullness, tenderness               Rectovaginal: Confirms               Anus:  normal sphincter tone, no lesions  Cornelia Copa, CMA chaperoned for  the exam.  1. Well woman exam Discussed breast self exam Discussed calcium and vit D intake She will schedule her mammogram  2. Screening for cervical cancer - Cytology - PAP  3. Laboratory exam ordered as part of routine general medical examination - CBC - Comprehensive metabolic panel - Lipid panel  4. Prediabetes - Hemoglobin A1c  5. Vitamin D deficiency - VITAMIN D 25 Hydroxy (Vit-D Deficiency, Fractures)  6. BMI 40.0-44.9, adult (HCC) - TSH -She is working on weight loss

## 2021-05-23 ENCOUNTER — Encounter: Payer: Self-pay | Admitting: Obstetrics and Gynecology

## 2021-05-23 ENCOUNTER — Other Ambulatory Visit: Payer: Self-pay

## 2021-05-23 ENCOUNTER — Other Ambulatory Visit (HOSPITAL_COMMUNITY)
Admission: RE | Admit: 2021-05-23 | Discharge: 2021-05-23 | Disposition: A | Discharge status: Home / Self Care | Payer: 59 | Source: Ambulatory Visit | Attending: Obstetrics and Gynecology | Admitting: Obstetrics and Gynecology

## 2021-05-23 ENCOUNTER — Ambulatory Visit (INDEPENDENT_AMBULATORY_CARE_PROVIDER_SITE_OTHER): Payer: 59 | Admitting: Obstetrics and Gynecology

## 2021-05-23 VITALS — BP 110/68 | HR 81 | Resp 16 | Ht 59.5 in | Wt 219.0 lb

## 2021-05-23 DIAGNOSIS — Z124 Encounter for screening for malignant neoplasm of cervix: Secondary | ICD-10-CM | POA: Diagnosis not present

## 2021-05-23 DIAGNOSIS — Z01419 Encounter for gynecological examination (general) (routine) without abnormal findings: Secondary | ICD-10-CM | POA: Diagnosis not present

## 2021-05-23 DIAGNOSIS — Z6841 Body Mass Index (BMI) 40.0 and over, adult: Secondary | ICD-10-CM

## 2021-05-23 DIAGNOSIS — R7303 Prediabetes: Secondary | ICD-10-CM | POA: Diagnosis not present

## 2021-05-23 DIAGNOSIS — E559 Vitamin D deficiency, unspecified: Secondary | ICD-10-CM

## 2021-05-23 DIAGNOSIS — Z0000 Encounter for general adult medical examination without abnormal findings: Secondary | ICD-10-CM | POA: Diagnosis not present

## 2021-05-23 NOTE — Patient Instructions (Signed)

## 2021-05-24 LAB — LIPID PANEL
Cholesterol: 218 mg/dL — ABNORMAL HIGH (ref <200)
HDL: 55 mg/dL (ref >=50)
LDL Cholesterol (Calc): 143 mg/dL (calc) — ABNORMAL HIGH
Non-HDL Cholesterol (Calc): 163 mg/dL (calc) — ABNORMAL HIGH (ref <130)
Total CHOL/HDL Ratio: 4 (calc) (ref <5.0)
Triglycerides: 96 mg/dL (ref <150)

## 2021-05-24 LAB — COMPREHENSIVE METABOLIC PANEL
AG Ratio: 1.4 (calc) (ref 1.0–2.5)
ALT: 17 U/L (ref 6–29)
AST: 15 U/L (ref 10–30)
Albumin: 4 g/dL (ref 3.6–5.1)
Alkaline phosphatase (APISO): 66 U/L (ref 31–125)
BUN: 10 mg/dL (ref 7–25)
CO2: 24 mmol/L (ref 20–32)
Calcium: 9.6 mg/dL (ref 8.6–10.2)
Chloride: 105 mmol/L (ref 98–110)
Creat: 0.8 mg/dL (ref 0.50–0.99)
Globulin: 2.9 g/dL (calc) (ref 1.9–3.7)
Glucose, Bld: 85 mg/dL (ref 65–99)
Potassium: 4.4 mmol/L (ref 3.5–5.3)
Sodium: 138 mmol/L (ref 135–146)
Total Bilirubin: 0.4 mg/dL (ref 0.2–1.2)
Total Protein: 6.9 g/dL (ref 6.1–8.1)

## 2021-05-24 LAB — CBC
HCT: 39.6 % (ref 35.0–45.0)
Hemoglobin: 12.9 g/dL (ref 11.7–15.5)
MCH: 26.7 pg — ABNORMAL LOW (ref 27.0–33.0)
MCHC: 32.6 g/dL (ref 32.0–36.0)
MCV: 81.8 fL (ref 80.0–100.0)
MPV: 8.9 fL (ref 7.5–12.5)
Platelets: 409 10*3/uL — ABNORMAL HIGH (ref 140–400)
RBC: 4.84 10*6/uL (ref 3.80–5.10)
RDW: 13.8 % (ref 11.0–15.0)
WBC: 5.8 10*3/uL (ref 3.8–10.8)

## 2021-05-24 LAB — VITAMIN D 25 HYDROXY (VIT D DEFICIENCY, FRACTURES): Vit D, 25-Hydroxy: 27 ng/mL — ABNORMAL LOW (ref 30–100)

## 2021-05-24 LAB — CYTOLOGY - PAP
Comment: NEGATIVE
Diagnosis: NEGATIVE
High risk HPV: NEGATIVE

## 2021-05-24 LAB — HEMOGLOBIN A1C
Hgb A1c MFr Bld: 5.5 % of total Hgb (ref <5.7)
Mean Plasma Glucose: 111 mg/dL
eAG (mmol/L): 6.2 mmol/L

## 2021-05-24 LAB — TSH: TSH: 1.74 mIU/L

## 2021-06-25 ENCOUNTER — Encounter: Payer: 59 | Attending: Obstetrics and Gynecology | Admitting: Dietician

## 2021-06-25 ENCOUNTER — Other Ambulatory Visit: Payer: Self-pay

## 2021-06-25 ENCOUNTER — Encounter: Payer: Self-pay | Admitting: Dietician

## 2021-06-25 NOTE — Patient Instructions (Addendum)
Work on lowering your saturated fat intake, and increasing soluble fiber to help lower your LDL cholesterol.  Use your worksheets to come up with strategies   Do one thing to step outside of your comfort zone, especially when it comes to being social. This can be by joining a group yoga or exercise class, or social gathering.  Keep up the great work!!

## 2021-06-25 NOTE — Progress Notes (Signed)
Medical Nutrition Therapy:    Appt start time: 1045 end time:  1120.   Assessment:  Primary concerns today: Nutrition Follow-Up   Pt reports children are back in school now, which has given them more time during the day. 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 Pt reports attending multiple free food events and getting a large amount of fresh produce. Pt went for annual check up on 05/23/2021. Pt total cholesterol (218) and LDL (143) are elevated, A1c (5.5) is in range. Pt reports their provider referred them to a wellness and weight loss clinic, and recommended a low saturated fat diet. Pt reports they still struggle to be more physically active. Pt states that when they have free time, they just want to relax. Pt states they would benefit from having an exercise partner but doesn't know of anyone at the moment.  Ht: 4'11" Wt: 220 lbs Body mass index is 44.43 kg/m.   Body Composition Scale Date 05/17/2020  06/15/2020  07/19/2020  08/23/2020  10/01/2020  10/30/2020 05/02/2021 06/25/2021  Current Body Weight 225.6 lbs 220.3 lbs 215.8 lbs 210.6 lbs 209.6 lbs 210.3 lbs 221.0 lbs 220.0 lbs  Total Body Fat % 46 45.4 44.9 44.3 44.2 44.3 45.5 45.5  Visceral Fat 17 16 15 15 15 15 16 16   Fat-Free Mass % 53.9 54.5 55.0 55.6 55.7 55.6 54.4 54.4   Total Body Water % 41.4 41.7 42.0 42.3 42.3 42.3 41.7 41.7  Muscle-Mass lbs 27.9 27.8 27.8 27.7 27.7 27.7 27.8 27.7  BMI 45.7 44.5 43.6 42.6 42.4 42.5 44.7 44.5  Body Fat Displacement                 Torso  lbs 64.3 61.9 59.9 57.8 57.4 57.7 62.3 62.0         Left Leg  lbs 12.8 12.3 11.9 11.5 11.4 11.5 12.4 12.4         Right Leg  lbs 12.8 12.3 11.9 11.5 11.4 11.5 12.4 12.4         Left Arm  lbs 6.4 6.1 5.9 5.7 5.7 5.7 6.2 6.2         Right Arm   lbs 6.4 6.1 5.9 5.7 5.7 5.7 6.2  6.2    Preferred Learning Style:  No preference indicated   Learning Readiness:  Ready   MEDICATIONS: Vitamin B12, D3, Iron   DIETARY  INTAKE:  Usual eating pattern includes 3 meals and occasional snacks in the evening.  Everyday foods include None.  Avoided foods include avocado.     B ( AM): Small bag of Doritos, 2 bags cheese balls, Monster energy drink Snk ( AM): Imitation crab meat  L ( PM): [employer] cheeseburger Snk ( PM): Monster energy drink D ( PM): 5 chicken wings, med fries, 1/2 chocolate bar Snk ( PM): none Beverages: 2 bottles water, 2 monsters  Usual physical activity: ADLs   Progress Towards Goal(s):  In progress.   Nutritional Diagnosis:  NB-1.1 Food and nutrition-related knowledge deficit As related to prediabetes.  As evidenced by A1c of 5.9, and food choices indicated in 24 hr recall.    Intervention:  Nutrition Education.   Educate patient on her body composition results, and how changes in body composition are more meaningful indicators of health than weight alone. Discuss the importance of retaining fat-free mass while losing weight. Discuss potential startegies to mitigate bored eating late in the evening. Work with pt to find ways  to avoid missing meals throughout the day. Encourage pt to continue walks. Educate pt on the implications of consistently consuming energy drinks. Work with pt to identify healthier substitutions while eating away from home.  Educated pt on strategies to reducing intake of high fat/high calorie foods. Educated pt on energy balance, and how to control energy in and energy out to achieve weight loss. NEW: Educate pt on the difference between LDL and HDL cholesterol. Educate pt on the factors that can increase and decrease HDL cholesterol. Educate pt on factors that can elevate LDL cholesterol, including high dietary intake of saturated fats. Educate pt on identifying sources of saturated fats, and how to make alternative food choices to lower saturated fat intake. Educate pt on the role of soluble fiber in binding to cholesterol in the GI tract an eliminating it from  the body. Educate pt on dietary sources of soluble fiber. Educate pt on the role of physical activity in lowering LDL and increasing HDL cholesterol.   Goals: Work on lowering your saturated fat intake, and increasing soluble fiber to help lower your LDL cholesterol.  Use your worksheets to come up with strategies  Do one thing to step outside of your comfort zone, especially when it comes to being social. This can be by joining a group yoga or exercise class, or social gathering. Keep up the great work!!    NEW Handouts: Food to Choose to [employer] Cholesterol, Saturated Fats Worksheet High fiber Nutrient List Nutrition Care Manual  Teaching Method Utilized:  Scientific laboratory technician   Barriers to learning/adherence to lifestyle change: None  Demonstrated degree of understanding via:  Teach Back   Monitoring/Evaluation:  Dietary intake, exercise, and body weight in 2 month(s).

## 2021-08-27 ENCOUNTER — Other Ambulatory Visit: Payer: Self-pay | Admitting: Nurse Practitioner

## 2021-08-27 ENCOUNTER — Encounter: Payer: Self-pay | Admitting: Dietician

## 2021-08-27 ENCOUNTER — Other Ambulatory Visit: Payer: Self-pay

## 2021-08-27 ENCOUNTER — Ambulatory Visit
Admission: RE | Admit: 2021-08-27 | Discharge: 2021-08-27 | Disposition: A | Discharge status: Home / Self Care | Payer: 59 | Source: Ambulatory Visit | Attending: Nurse Practitioner | Admitting: Nurse Practitioner

## 2021-08-27 ENCOUNTER — Encounter: Payer: 59 | Attending: Obstetrics and Gynecology | Admitting: Dietician

## 2021-08-27 DIAGNOSIS — Z1231 Encounter for screening mammogram for malignant neoplasm of breast: Secondary | ICD-10-CM

## 2021-08-27 NOTE — Progress Notes (Signed)
Medical Nutrition Therapy:    Appt start time: 0930 end time:  1000.   Assessment:  Primary concerns today: Nutrition Follow-Up    Pt reports that they feel that they are hitting a plateau in their motivation to exercise, pt feels they have a mental block that they can't get past. Pt states they need to focus on one thing at a time or they will get overwhelmed. Pt is working for [employer] currently. Pt reports financial difficulties at the moment but no concerns over food security. Pt is still getting food boxes from [employer]. Pt is still consuming Monster energy drinks, but is choosing Zero sugar varieties.  Ht: 4'11" Wt: 218 lbs Body mass index is 44.03 kg/m.   Body Composition Scale Date 05/17/2020  06/15/2020  07/19/2020  08/23/2020  10/01/2020  10/30/2020 05/02/2021 06/25/2021 08/27/2021  Current Body Weight 225.6 lbs 220.3 lbs 215.8 lbs 210.6 lbs 209.6 lbs 210.3 lbs 221.0 lbs 220.0 lbs 218.0  Total Body Fat % 46 45.4 44.9 44.3 44.2 44.3 45.5 45.5 45.2  Visceral Fat 17 16 15 15 15 15 16 16 16   Fat-Free Mass % 53.9 54.5 55.0 55.6 55.7 55.6 54.4 54.4 54.7   Total Body Water % 41.4 41.7 42.0 42.3 42.3 42.3 41.7 41.7 41.8  Muscle-Mass lbs 27.9 27.8 27.8 27.7 27.7 27.7 27.8 27.7 27.7  BMI 45.7 44.5 43.6 42.6 42.4 42.5 44.7 44.5 44.0  Body Fat Displacement                  Torso  lbs 64.3 61.9 59.9 57.8 57.4 57.7 62.3 62.0 61.0         Left Leg  lbs 12.8 12.3 11.9 11.5 11.4 11.5 12.4 12.4 12.2         Right Leg  lbs 12.8 12.3 11.9 11.5 11.4 11.5 12.4 12.4 12.2         Left Arm  lbs 6.4 6.1 5.9 5.7 5.7 5.7 6.2 6.2 6.1         Right Arm   lbs 6.4 6.1 5.9 5.7 5.7 5.7 6.2  6.2 6.1    Preferred Learning Style:  No preference indicated   Learning Readiness:  Ready   MEDICATIONS: Vitamin B12, D3, Iron   DIETARY INTAKE:  Usual eating pattern includes 3 meals and occasional snacks in the evening.  Everyday foods include None.  Avoided foods include avocado.     B ( AM):  Monster Zero Sugar Snk ( AM):  L ( PM): Hamburger, fries, Monster Zero Sugar Snk ( PM):  D ( PM): Teryaki stir fry vegetables, kielbasa, chicken breast Snk ( PM): 2 mini gingerbread cookies Beverages: 1 bottles water, 2 monsters  Usual physical activity: ADLs   Progress Towards Goal(s):  In progress.   Nutritional Diagnosis:  NB-1.1 Food and nutrition-related knowledge deficit As related to prediabetes.  As evidenced by A1c of 5.9, and food choices indicated in 24 hr recall.    Intervention:  Nutrition Education.   Educate patient on her body composition results, and how changes in body composition are more meaningful indicators of health than weight alone. Discuss the importance of retaining fat-free mass while losing weight. Discuss potential startegies to mitigate bored eating late in the evening. Work with pt to find ways to avoid missing meals throughout the day. Encourage pt to continue walks. Educate pt on the implications of consistently consuming energy drinks. Work with pt to identify healthier substitutions while eating away from home.  Educated pt on strategies to reducing intake of high fat/high calorie foods. Educated pt on energy balance, and how to control energy in and energy out to achieve weight loss. NEW: Educate pt on the difference between LDL and HDL cholesterol. Educate pt on the factors that can increase and decrease HDL cholesterol. Educate pt on factors that can elevate LDL cholesterol, including high dietary intake of saturated fats. Educate pt on identifying sources of saturated fats, and how to make alternative food choices to lower saturated fat intake. Educate pt on the role of soluble fiber in binding to cholesterol in the GI tract an eliminating it from the body. Educate pt on dietary sources of soluble fiber. Educate pt on the role of physical activity in lowering LDL and increasing HDL cholesterol.   Goals: Use a combination of these three  strategies to effectively lower your consumption of saturated fat 1) Consume a smaller portion when having animal products 2) Consume high fat animal products less frequently 3) Find a reduced or low fat version Choose lean meats like skinless poultry, chicken breast/tenderloins, lean pork chops, pork loin, [place] sausage, [place] bacon, lean ground beef. Read your nutrition labels on your packaged foods, and look for no more than 7% daily value of SATURATED FAT per serving. Increase your water consumption to 3-4 bottles a day.   NEW Handouts: Food to Choose to Lower Your Cholesterol, Saturated Fats Worksheet High fiber Nutrient List Nutrition Care Manual  Teaching Method Utilized:  Visual Auditory   Barriers to learning/adherence to lifestyle change: None  Demonstrated degree of understanding via:  Teach Back   Monitoring/Evaluation:  Dietary intake, exercise, and body weight in 2 month(s).

## 2021-08-27 NOTE — Patient Instructions (Addendum)
Use a combination of these three strategies to effectively lower your consumption of saturated fat 1) Consume a smaller portion when having animal products 2) Consume high fat animal products less frequently 3) Find a reduced or low fat version  Choose lean meats like skinless poultry, chicken breast/tenderloins, lean pork chops, pork loin, [place] sausage, [place] bacon, lean ground beef.  Read your nutrition labels on your packaged foods, and look for no more than 7% daily value of SATURATED FAT per serving.  Increase your water consumption to 3-4 bottles a day.

## 2021-10-30 ENCOUNTER — Ambulatory Visit: Payer: 59 | Admitting: Dietician

## 2021-11-06 ENCOUNTER — Ambulatory Visit: Payer: Self-pay | Admitting: Dietician

## 2021-12-17 ENCOUNTER — Other Ambulatory Visit: Payer: Self-pay

## 2021-12-17 ENCOUNTER — Encounter: Payer: 59 | Attending: Obstetrics and Gynecology | Admitting: Dietician

## 2021-12-17 ENCOUNTER — Encounter: Payer: Self-pay | Admitting: Dietician

## 2021-12-17 NOTE — Progress Notes (Signed)
Medical Nutrition Therapy:   ? ?Appt start time: 0800 end time:  0830. ? ? ?Assessment:  Primary concerns today: Nutrition Follow-Up  ? ?Pt reports setting 2 goals for the new year: eat at a new restaurant each month, and go for a hike each month. Pt states that they are now going hiking with their daughter and friend. Pt has two hikes planned for April. Pt is also walking with their brother in the evenings. ?Pt is still reading food labels to find lower saturated fat options, eating more vegetables and grilled proteins on their [employer]. Pt has been stocking up on frozen vegetables from [employer], pt has been letting their daughter pick the vegetable that they are going to eat for dinner the day. ?Pt has been drinking a lot less Monster energy drinks, has been drinking low sugar sparkling waters and bottled waters.  ? ?Ht: 4'11" ?Wt: 219.5 lbs ?Body mass index is 44.33 kg/m?. ? ? ?Body Composition Scale Date ?05/17/2020  ?06/15/2020  ?07/19/2020  ?08/23/2020  ?10/01/2020  ?10/30/2020 05/02/2021 06/25/2021 08/27/2021 12/17/2021  ?Current Body Weight 225.6 lbs 220.3 lbs 215.8 lbs 210.6 lbs 209.6 lbs 210.3 lbs 221.0 lbs 220.0 lbs 218.0 219.5 lbs  ?Total Body Fat % 46 45.4 44.9 44.3 44.2 44.3 45.5 45.5 45.2 45.5  ?Visceral Fat 17 16 15 15 15 15 16 16 16 16   ?Fat-Free Mass % 53.9 54.5 55.0 55.6 55.7 55.6 54.4 54.4 54.7 54.4  ? Total Body Water % 41.4 41.7 42.0 42.3 42.3 42.3 41.7 41.7 41.8 41.7  ?Muscle-Mass lbs 27.9 27.8 27.8 27.7 27.7 27.7 27.8 27.7 27.7 27.7  ?BMI 45.7 44.5 43.6 42.6 42.4 42.5 44.7 44.5 44.0 44.4  ?Body Fat Displacement            ?       Torso  lbs 64.3 61.9 59.9 57.8 57.4 57.7 62.3 62.0 61.0 61.8  ?       Left Leg  lbs 12.8 12.3 11.9 11.5 11.4 11.5 12.4 12.4 12.2 12.3  ?       Right Leg  lbs 12.8 12.3 11.9 11.5 11.4 11.5 12.4 12.4 12.2 12.3  ?       Left Arm  lbs 6.4 6.1 5.9 5.7 5.7 5.7 6.2 6.2 6.1 6.1  ?       Right Arm   lbs 6.4 6.1 5.9 5.7 5.7 5.7 6.2 ? 6.2 6.1 6.1  ? ? ?Preferred Learning Style:   ?No preference indicated  ? ?Learning Readiness:  ?Ready ? ? ?MEDICATIONS: Vitamin B12, D3, Iron ?  ?DIETARY INTAKE: ? ?Usual eating pattern includes 3 meals and occasional snacks in the evening. ? ?Everyday foods include None.  Avoided foods include avocado.   ? ? ?B ( AM): Bagel, vegan butter, water ?Snk ( AM):  ?L ( PM): Cheeseburger macaroni hamburger helper ?Snk ( PM): Grapes ?D ( PM): Tripps calamari, spring roll, monster ?Snk ( PM):  ?Beverages: water, monster ? ? ?Usual physical activity: ADLs, NEW: Started hiking monthly ? ? ?Progress Towards Goal(s):  In progress. ?  ?Nutritional Diagnosis:  ?NB-1.1 Food and nutrition-related knowledge deficit As related to prediabetes.  As evidenced by A1c of 5.9, and food choices indicated in 24 hr recall. ?   ?Intervention:  Nutrition Education. ? ? Educate patient on her body composition results, and how changes in body composition are more meaningful indicators of health than weight alone. Discuss the importance of retaining fat-free mass while losing weight. Discuss potential startegies to  mitigate bored eating late in the evening. Work with pt to find ways to avoid missing meals throughout the day. Encourage pt to continue walks. Educate pt on the implications of consistently consuming energy drinks. Work with pt to identify healthier substitutions while eating away from home.  ?Educated pt on strategies to reducing intake of high fat/high calorie foods. ?Educated pt on energy balance, and how to control energy in and energy out to achieve weight loss. ?Educate pt on the difference between LDL and HDL cholesterol. Educate pt on the factors that can increase and decrease HDL cholesterol. Educate pt on factors that can elevate LDL cholesterol, including high dietary intake of saturated fats. Educate pt on identifying sources of saturated fats, and how to make alternative food choices to lower saturated fat intake. Educate pt on the role of soluble fiber in binding to  cholesterol in the GI tract an eliminating it from the body. Educate pt on dietary sources of soluble fiber. Educate pt on the role of physical activity in lowering LDL and increasing HDL cholesterol. ? ? ?Goals: ?Keep up the great work hiking! Try to get 5 more [place] end of summer!! ?Keep looking for new brands of sparkling water that you enjoy. ?Incorporate more grilled proteins with your meals.  ?Look into this website for ideas! ?https://foremangrillrecipes.com/ ? ?Handouts:  ?Food to Choose to Lower Your Cholesterol  ?Saturated Fats Worksheet ?High fiber Nutrient List Nutrition Care Manual ? ? ?Teaching Method Utilized:  ?Visual ?Auditory ? ? ?Barriers to learning/adherence to lifestyle change: None ? ?Demonstrated degree of understanding via:  Teach Back  ? ?Monitoring/Evaluation:  Dietary intake, exercise, and body weight in 3 month(s). ?

## 2021-12-17 NOTE — Patient Instructions (Addendum)
Keep up the great work hiking! Try to get 5 more [place] your [place] by the end of summer!! ? ?Keep looking for new brands of sparkling water that you enjoy. ? ?Incorporate more grilled proteins with your meals.  ?Look into this website for ideas! ?https://foremangrillrecipes.com/ ?

## 2022-01-15 ENCOUNTER — Encounter: Payer: Self-pay | Admitting: Dietician

## 2022-01-15 ENCOUNTER — Encounter: Payer: 59 | Attending: Obstetrics and Gynecology | Admitting: Dietician

## 2022-01-15 DIAGNOSIS — Z713 Dietary counseling and surveillance: Secondary | ICD-10-CM | POA: Insufficient documentation

## 2022-01-15 DIAGNOSIS — Z6841 Body Mass Index (BMI) 40.0 and over, adult: Secondary | ICD-10-CM | POA: Insufficient documentation

## 2022-01-15 DIAGNOSIS — R7303 Prediabetes: Secondary | ICD-10-CM | POA: Diagnosis not present

## 2022-01-15 NOTE — Patient Instructions (Addendum)
Keep up the great work!! Think of me being on your shoulder throughout the day. ? ?Stick to your exercise regiment! Spend as much time in the water as possible when you start swimming! ? ?Remember that I am always extremely proud of you! You are an amazing example of hard work and consistency!  ?

## 2022-01-15 NOTE — Progress Notes (Signed)
Medical Nutrition Therapy:   ? ?Appt start time: 1045 end time:  1120. ? ? ?Assessment:  Primary concerns today: Nutrition Follow-Up  ? ?Pt reports getting financial assistance from the [facility], so they have been able to start exercising. Pt worked with a Psychologist, educational for 3 sessions and now has a workout schedule. Pt will be taking swimming lessons in June. ?Pt will be going hiking this weekend, still doing one a month. Pt has been walking more too, averaging 10,000 steps. ?Pt has been drinking less Monsters, trying sparkling water (Bubbli, [place]Croix). Pt is having more grilled and baked foods. ?Pt is still doing [employer] for work during the day. ? ? ?Ht: 4'11" ?Wt: 215.9 lbs ?Wt Change: - 4 lbs ?Body mass index is 43.61 kg/m?. ? ? ?Body Composition Scale Date ?05/17/2020  ?06/15/2020  ?07/19/2020  ?08/23/2020  ?10/01/2020  ?10/30/2020 05/02/2021 06/25/2021 08/27/2021 12/17/2021 01/15/2022  ?Current Body Weight 225.6 lbs 220.3 lbs 215.8 lbs 210.6 lbs 209.6 lbs 210.3 lbs 221.0 lbs 220.0 lbs 218.0 219.5 lbs 215.9  ?Total Body Fat % 46 45.4 44.9 44.3 44.2 44.3 45.5 45.5 45.2 45.5 44.2  ?Visceral Fat 17 16 15 15 15 15 16 16 16 16    ?Fat-Free Mass % 53.9 54.5 55.0 55.6 55.7 55.6 54.4 54.4 54.7 54.4 55.6  ? Total Body Water % 41.4 41.7 42.0 42.3 42.3 42.3 41.7 41.7 41.8 41.7 42.3  ?Muscle-Mass lbs 27.9 27.8 27.8 27.7 27.7 27.7 27.8 27.7 27.7 27.7   ?BMI 45.7 44.5 43.6 42.6 42.4 42.5 44.7 44.5 44.0 44.4 44.2  ?Body Fat Displacement             ?       Torso  lbs 64.3 61.9 59.9 57.8 57.4 57.7 62.3 62.0 61.0 61.8   ?       Left Leg  lbs 12.8 12.3 11.9 11.5 11.4 11.5 12.4 12.4 12.2 12.3   ?       Right Leg  lbs 12.8 12.3 11.9 11.5 11.4 11.5 12.4 12.4 12.2 12.3   ?       Left Arm  lbs 6.4 6.1 5.9 5.7 5.7 5.7 6.2 6.2 6.1 6.1   ?       Right Arm   lbs 6.4 6.1 5.9 5.7 5.7 5.7 6.2 ? 6.2 6.1 6.1   ? ? ?Preferred Learning Style:  ?No preference indicated  ? ?Learning Readiness:  ?Ready ? ? ?MEDICATIONS: Vitamin B12, D3, Iron ?  ?DIETARY  INTAKE: ? ?Usual eating pattern includes 3 meals and occasional snacks in the evening. ? ?Everyday foods include None.  Avoided foods include avocado.   ? ? ?B ( AM):  ?Snk ( AM):  ?L ( PM):  ?Snk ( PM):  ?D ( PM):  ?Snk ( PM):  ?Beverages: water, monster ? ? ?Usual physical activity: ADLs, NEW: Started hiking monthly, joined the ? ? ?Progress Towards Goal(s):  In progress. ?  ?Nutritional Diagnosis:  ?NB-1.1 Food and nutrition-related knowledge deficit As related to prediabetes.  As evidenced by A1c of 5.9, and food choices indicated in 24 hr recall. ?   ?Intervention:  Nutrition Education. ? ? Educate patient on her body composition results, and how changes in body composition are more meaningful indicators of health than weight alone. Discuss the importance of retaining fat-free mass while losing weight. Discuss potential startegies to mitigate bored eating late in the evening. Work with pt to find ways to avoid missing meals throughout the day. Encourage pt to continue walks.  Educate pt on the implications of consistently consuming energy drinks. Work with pt to identify healthier substitutions while eating away from home.  ?Educated pt on strategies to reducing intake of high fat/high calorie foods. ?Educated pt on energy balance, and how to control energy in and energy out to achieve weight loss. ?Educate pt on the difference between LDL and HDL cholesterol. Educate pt on the factors that can increase and decrease HDL cholesterol. Educate pt on factors that can elevate LDL cholesterol, including high dietary intake of saturated fats. Educate pt on identifying sources of saturated fats, and how to make alternative food choices to lower saturated fat intake. Educate pt on the role of soluble fiber in binding to cholesterol in the GI tract an eliminating it from the body. Educate pt on dietary sources of soluble fiber. Educate pt on the role of physical activity in lowering LDL and increasing HDL  cholesterol. ? ? ?Goals: ?Keep up the great work!! Think of me being on your shoulder throughout the day. ?Stick to your exercise regiment! Spend as much time in the water as possible when you start swimming! ?Remember that I am always extremely proud of you! You are an amazing example of hard work and consistency!  ? ?Handouts:  ?Food to Choose to Lower Your Cholesterol  ?Saturated Fats Worksheet ?High fiber Nutrient List Nutrition Care Manual ? ? ?Teaching Method Utilized:  ?Visual ?Auditory ? ? ?Barriers to learning/adherence to lifestyle change: None ? ?Demonstrated degree of understanding via:  Teach Back  ? ?Monitoring/Evaluation:  Dietary intake, exercise, and body weight PRN ?

## 2022-03-31 ENCOUNTER — Ambulatory Visit: Payer: 59 | Admitting: Dietician

## 2022-05-29 ENCOUNTER — Ambulatory Visit: Payer: 59 | Admitting: Obstetrics and Gynecology

## 2022-06-11 ENCOUNTER — Ambulatory Visit: Payer: 59 | Admitting: Obstetrics and Gynecology

## 2022-06-17 NOTE — Progress Notes (Signed)
46 y.o. [id number] Single Black or African American Not Hispanic or Latino female here for annual exam.   Period Cycle (Days): 28 Period Duration (Days): 5 Period Pattern: Regular Menstrual Flow: Moderate Menstrual Control: Thin pad, Maxi pad Dysmenorrhea: None Dysmenorrhea Symptoms: Cramping She can saturate a pad in 4 hours.   Patient's last menstrual period was 06/09/22         Sexually active: No.  The current method of family planning is abstinence.    Exercising: no   Smoking: no  Health Maintenance: Pap:  05/23/21 wnl, HPV negative; 05-05-18 neg HPV HR neg History of abnormal Pap:  yes ,HR HPV 2016 MMG:  08/27/21 BI-RAD 1 BMD:   none Colonoscopy: none TDaP:  2019 Gardasil: no   reports that she has never smoked. She has never used smokeless tobacco. She reports that she does not drink alcohol and does not use drugs. 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  Daughter is 91.   Past Medical History:  Diagnosis Date   Morbid obesity ([place]) 03/30/2020   Prediabetes 05/15/2020   a1c 5.30 Apr 2020 Dr. Talbert Nan    Past Surgical History:  Procedure Laterality Date   CESAREAN SECTION  2011   x 1    Current Outpatient Medications  Medication Sig Dispense Refill   Cyanocobalamin (VITAMIN B-12 CR PO) Take 2 capsules by mouth.     Multiple Vitamin (MULTIVITAMIN PO) Take by mouth.     Cholecalciferol (VITAMIN D3 GUMMIES PO) Take 2 capsules by mouth. (Patient not taking: Reported on 05/23/2021)     Ferrous Sulfate (IRON PO) Take 2 capsules by mouth. (Patient not taking: Reported on 09/05/2020)     No current facility-administered medications for this visit.  Ran out of vit d  Family History  Problem Relation Age of Onset   Hypertension Mother    Diabetes Mother    Diabetes Father    Hypertension Father    Asthma Brother    Breast cancer Paternal Aunt    Breast cancer Maternal Grandmother    Breast cancer Paternal Grandmother     Review of Systems  All other  systems reviewed and are negative.   Exam:   BP 124/76 (BP Location: Left Arm, Patient Position: Sitting, Cuff Size: Large)   Pulse 79   Ht 4' 11.75" (1.518 m)   Wt 212 lb (96.2 kg)   LMP 06/09/2022 (Exact Date)   SpO2 98%   BMI 41.75 kg/m   Weight change: @WEIGHTCHANGE @ Height:   Height: 4' 11.75" (151.8 cm)  Ht Readings from Last 3 Encounters:  06/20/22 4' 11.75" (1.518 m)  01/15/22 4\' 11"  (1.499 m)  12/17/21 4\' 11"  (1.499 m)    General appearance: alert, cooperative and appears stated age Head: Normocephalic, without obvious abnormality, atraumatic Neck: no adenopathy, supple, symmetrical, trachea midline and thyroid normal to inspection and palpation Lungs: clear to auscultation bilaterally Cardiovascular: regular rate and rhythm Breasts: normal appearance, no masses or tenderness Abdomen: soft, non-tender; non distended,  no masses,  no organomegaly Extremities: extremities normal, atraumatic, no cyanosis or edema Skin: Skin color, texture, turgor normal. No rashes or lesions Lymph nodes: Cervical, supraclavicular, and axillary nodes normal. No abnormal inguinal nodes palpated Neurologic: Grossly normal   Pelvic: External genitalia:  no lesions              Urethra:  normal appearing urethra with no masses, tenderness or lesions              Bartholins  and Skenes: normal                 Vagina: normal appearing vagina with normal color and discharge, no lesions              Cervix: no lesions               Bimanual Exam:  Uterus:  normal size, contour, position, consistency, mobility, non-tender              Adnexa: no mass, fullness, tenderness               Rectovaginal: Confirms               Anus:  normal sphincter tone, no lesions  Santiago Glad, CMA chaperoned for the exam.  1. Well woman exam Discussed breast self exam Discussed calcium and vit D intake No pap this year Mammogram in 12/23  2. Vitamin D deficiency Not currently taking her vit D, ran out -  VITAMIN D 25 Hydroxy (Vit-D Deficiency, Fractures)  3. BMI 40.0-44.9, adult (HCC) - Comprehensive metabolic panel - Hemoglobin A1c - Lipid panel  4. Elevated LDL cholesterol level - Lipid panel  5. Laboratory exam ordered as part of routine general medical examination - Comprehensive metabolic panel  6. Elevated platelet count - CBC  7. History of prediabetes - Hemoglobin A1c

## 2022-06-20 ENCOUNTER — Ambulatory Visit (INDEPENDENT_AMBULATORY_CARE_PROVIDER_SITE_OTHER): Payer: Commercial Managed Care - HMO | Admitting: Obstetrics and Gynecology

## 2022-06-20 ENCOUNTER — Encounter: Payer: Self-pay | Admitting: Obstetrics and Gynecology

## 2022-06-20 VITALS — BP 124/76 | HR 79 | Ht 59.75 in | Wt 212.0 lb

## 2022-06-20 DIAGNOSIS — E559 Vitamin D deficiency, unspecified: Secondary | ICD-10-CM

## 2022-06-20 DIAGNOSIS — R7989 Other specified abnormal findings of blood chemistry: Secondary | ICD-10-CM

## 2022-06-20 DIAGNOSIS — Z01419 Encounter for gynecological examination (general) (routine) without abnormal findings: Secondary | ICD-10-CM | POA: Diagnosis not present

## 2022-06-20 DIAGNOSIS — Z6841 Body Mass Index (BMI) 40.0 and over, adult: Secondary | ICD-10-CM | POA: Diagnosis not present

## 2022-06-20 DIAGNOSIS — Z87898 Personal history of other specified conditions: Secondary | ICD-10-CM

## 2022-06-20 DIAGNOSIS — Z0000 Encounter for general adult medical examination without abnormal findings: Secondary | ICD-10-CM

## 2022-06-20 DIAGNOSIS — E7800 Pure hypercholesterolemia, unspecified: Secondary | ICD-10-CM

## 2022-06-20 NOTE — Patient Instructions (Signed)

## 2022-06-21 LAB — HEMOGLOBIN A1C
Hgb A1c MFr Bld: 5.5 % of total Hgb (ref <5.7)
Mean Plasma Glucose: 111 mg/dL
eAG (mmol/L): 6.2 mmol/L

## 2022-06-21 LAB — COMPREHENSIVE METABOLIC PANEL
AG Ratio: 1.5 (calc) (ref 1.0–2.5)
ALT: 14 U/L (ref 6–29)
AST: 14 U/L (ref 10–30)
Albumin: 4 g/dL (ref 3.6–5.1)
Alkaline phosphatase (APISO): 65 U/L (ref 31–125)
BUN: 11 mg/dL (ref 7–25)
CO2: 22 mmol/L (ref 20–32)
Calcium: 9.1 mg/dL (ref 8.6–10.2)
Chloride: 106 mmol/L (ref 98–110)
Creat: 0.75 mg/dL (ref 0.50–0.99)
Globulin: 2.7 g/dL (calc) (ref 1.9–3.7)
Glucose, Bld: 89 mg/dL (ref 65–99)
Potassium: 4.4 mmol/L (ref 3.5–5.3)
Sodium: 138 mmol/L (ref 135–146)
Total Bilirubin: 0.3 mg/dL (ref 0.2–1.2)
Total Protein: 6.7 g/dL (ref 6.1–8.1)

## 2022-06-21 LAB — CBC
HCT: 37.2 % (ref 35.0–45.0)
Hemoglobin: 11.9 g/dL (ref 11.7–15.5)
MCH: 25.6 pg — ABNORMAL LOW (ref 27.0–33.0)
MCHC: 32 g/dL (ref 32.0–36.0)
MCV: 80 fL (ref 80.0–100.0)
MPV: 8.9 fL (ref 7.5–12.5)
Platelets: 380 10*3/uL (ref 140–400)
RBC: 4.65 10*6/uL (ref 3.80–5.10)
RDW: 14.4 % (ref 11.0–15.0)
WBC: 5 10*3/uL (ref 3.8–10.8)

## 2022-06-21 LAB — LIPID PANEL
Cholesterol: 197 mg/dL (ref <200)
HDL: 58 mg/dL (ref >=50)
LDL Cholesterol (Calc): 125 mg/dL (calc) — ABNORMAL HIGH
Non-HDL Cholesterol (Calc): 139 mg/dL (calc) — ABNORMAL HIGH (ref <130)
Total CHOL/HDL Ratio: 3.4 (calc) (ref <5.0)
Triglycerides: 53 mg/dL (ref <150)

## 2022-06-21 LAB — VITAMIN D 25 HYDROXY (VIT D DEFICIENCY, FRACTURES): Vit D, 25-Hydroxy: 29 ng/mL — ABNORMAL LOW (ref 30–100)

## 2022-11-25 ENCOUNTER — Other Ambulatory Visit: Payer: Self-pay | Admitting: Obstetrics and Gynecology

## 2022-11-25 DIAGNOSIS — Z1231 Encounter for screening mammogram for malignant neoplasm of breast: Secondary | ICD-10-CM

## 2022-11-27 ENCOUNTER — Ambulatory Visit
Admission: RE | Admit: 2022-11-27 | Discharge: 2022-11-27 | Disposition: A | Discharge status: Home / Self Care | Payer: Commercial Managed Care - HMO | Source: Ambulatory Visit | Attending: Obstetrics and Gynecology | Admitting: Obstetrics and Gynecology

## 2022-11-27 DIAGNOSIS — Z1231 Encounter for screening mammogram for malignant neoplasm of breast: Secondary | ICD-10-CM

## 2023-06-25 ENCOUNTER — Ambulatory Visit: Payer: Commercial Managed Care - HMO | Admitting: Obstetrics and Gynecology

## 2023-07-01 ENCOUNTER — Ambulatory Visit (INDEPENDENT_AMBULATORY_CARE_PROVIDER_SITE_OTHER): Payer: Managed Care, Other (non HMO) | Admitting: Obstetrics and Gynecology

## 2023-07-01 ENCOUNTER — Encounter: Payer: Self-pay | Admitting: Obstetrics and Gynecology

## 2023-07-01 ENCOUNTER — Other Ambulatory Visit (HOSPITAL_COMMUNITY)
Admission: RE | Admit: 2023-07-01 | Discharge: 2023-07-01 | Disposition: A | Discharge status: Home / Self Care | Payer: Managed Care, Other (non HMO) | Source: Ambulatory Visit | Attending: Obstetrics and Gynecology | Admitting: Obstetrics and Gynecology

## 2023-07-01 VITALS — BP 110/64 | HR 83 | Ht 59.25 in | Wt 216.0 lb

## 2023-07-01 DIAGNOSIS — E2839 Other primary ovarian failure: Secondary | ICD-10-CM

## 2023-07-01 DIAGNOSIS — Z1211 Encounter for screening for malignant neoplasm of colon: Secondary | ICD-10-CM

## 2023-07-01 DIAGNOSIS — Z0000 Encounter for general adult medical examination without abnormal findings: Secondary | ICD-10-CM | POA: Diagnosis present

## 2023-07-01 DIAGNOSIS — N951 Menopausal and female climacteric states: Secondary | ICD-10-CM | POA: Diagnosis not present

## 2023-07-01 DIAGNOSIS — Z1231 Encounter for screening mammogram for malignant neoplasm of breast: Secondary | ICD-10-CM

## 2023-07-01 DIAGNOSIS — Z01419 Encounter for gynecological examination (general) (routine) without abnormal findings: Secondary | ICD-10-CM

## 2023-07-01 DIAGNOSIS — N898 Other specified noninflammatory disorders of vagina: Secondary | ICD-10-CM

## 2023-07-01 NOTE — Progress Notes (Addendum)
47 y.o. y.o. female here for annual exam.    Patient's last menstrual period was 06/06/2023 (exact date). Period Duration (Days): 4-5 Period Pattern: Regular Menstrual Flow: Moderate Menstrual Control: Maxi pad Dysmenorrhea: None Q month and not bothered by her cycles   Blood pressure 110/64, pulse 83, height 4' 11.25" (1.505 m), weight 216 lb (98 kg), last menstrual period 06/06/2023, SpO2 98%.     Component Value Date/Time   DIAGPAP  05/23/2021 0853    - Negative for intraepithelial lesion or malignancy (NILM)   DIAGPAP  05/05/2018 0000    NEGATIVE FOR INTRAEPITHELIAL LESIONS OR MALIGNANCY.   DIAGPAP  05/05/2018 0000    FUNGAL ORGANISMS PRESENT CONSISTENT WITH CANDIDA SPP.   HPVHIGH Negative 05/23/2021 0853   ADEQPAP  05/23/2021 0853    Satisfactory for evaluation; transformation zone component PRESENT.   ADEQPAP  05/05/2018 0000    Satisfactory for evaluation  endocervical/transformation zone component PRESENT.    GYN HISTORY:    Component Value Date/Time   DIAGPAP  05/23/2021 0853    - Negative for intraepithelial lesion or malignancy (NILM)   DIAGPAP  05/05/2018 0000    NEGATIVE FOR INTRAEPITHELIAL LESIONS OR MALIGNANCY.   DIAGPAP  05/05/2018 0000    FUNGAL ORGANISMS PRESENT CONSISTENT WITH CANDIDA SPP.   HPVHIGH Negative 05/23/2021 0853   ADEQPAP  05/23/2021 0853    Satisfactory for evaluation; transformation zone component PRESENT.   ADEQPAP  05/05/2018 0000    Satisfactory for evaluation  endocervical/transformation zone component PRESENT.    OB History  Gravida Para Term Preterm AB Living  7 1 1  0 6 1  SAB IAB Ectopic Multiple Live Births  0 6 0 0 1    # Outcome Date GA Lbr Len/2nd Weight Sex Type Anes PTL Lv  7 Term 03/16/10 [redacted]w[redacted]d  9 lb 5 oz (4.224 kg) F CS-Unspec   LIV  6 IAB           5 IAB           4 IAB           3 IAB           2 IAB           1 IAB             Past Medical History:  Diagnosis Date   Morbid obesity ([facility]) 03/30/2020    Prediabetes 05/15/2020   a1c 5.30 Apr 2020 Dr. Oscar La    Past Surgical History:  Procedure Laterality Date   CESAREAN SECTION  2011   x 1    Current Outpatient Medications on File Prior to Visit  Medication Sig Dispense Refill   Cyanocobalamin (VITAMIN B-12 CR PO) Take by mouth.     VITAMIN D PO Take by mouth.     No current facility-administered medications on file prior to visit.    Social History   Socioeconomic History   Marital status: Single    Spouse name: Not on file   Number of children: 1   Years of education: Not on file   Highest education level: Not on file  Occupational History   Occupation: Multimedia programmer: [employer]  Tobacco Use   Smoking status: Never   Smokeless tobacco: Never  Vaping Use   Vaping status: Never Used  Substance and Sexual Activity   Alcohol use: No   Drug use: No   Sexual activity: Not Currently    Partners: Male  Birth control/protection: Abstinence  Other Topics Concern   Not on file  Social History Narrative   Not on file   Social Determinants of Health   Financial Resource Strain: Not on file  Food Insecurity: Not on file  Transportation Needs: Not on file  Physical Activity: Not on file  Stress: Not on file  Social Connections: Not on file  Intimate Partner Violence: Not on file    Family History  Problem Relation Age of Onset   Hypertension Mother    Diabetes Mother    Diabetes Father    Hypertension Father    Asthma Brother    Breast cancer Paternal Aunt    Breast cancer Maternal Grandmother    Breast cancer Paternal Grandmother      Allergies  Allergen Reactions   Penicillins       Patient's last menstrual period was Patient's last menstrual period was 06/06/2023 (exact date)..             Review of Systems Alls systems reviewed and are negative.     Physical Exam Constitutional:      Appearance: Normal appearance.  Genitourinary:     Vulva and urethral meatus  normal.     No lesions in the vagina.     Right Labia: No rash, lesions or skin changes.    Left Labia: No lesions, skin changes or rash.    Vaginal discharge present.     No vaginal tenderness.     No vaginal prolapse present.    No vaginal atrophy present.     Right Adnexa: not tender, not palpable and no mass present.    Left Adnexa: not tender, not palpable and no mass present.    No cervical motion tenderness or discharge.     Uterus is not enlarged, tender or irregular.     Uterus is anteverted.  Breasts:    Right: Normal.     Left: Normal.  HENT:     Head: Normocephalic.  Neck:     Thyroid: No thyroid mass, thyromegaly or thyroid tenderness.  Cardiovascular:     Rate and Rhythm: Normal rate and regular rhythm.     Heart sounds: Normal heart sounds, S1 normal and S2 normal.  Pulmonary:     Effort: Pulmonary effort is normal.     Breath sounds: Normal breath sounds and air entry.  Abdominal:     General: There is no distension.     Palpations: Abdomen is soft. There is no mass.     Tenderness: There is no abdominal tenderness. There is no guarding or rebound.  Musculoskeletal:        General: Normal range of motion.     Cervical back: Full passive range of motion without pain, normal range of motion and neck supple. No tenderness.     Right lower leg: No edema.     Left lower leg: No edema.  Neurological:     Mental Status: She is alert.  Skin:    General: Skin is warm.  Psychiatric:        Mood and Affect: Mood normal.        Behavior: Behavior normal.        Thought Content: Thought content normal.  Vitals and nursing note reviewed. Exam conducted with a chaperone present.       A:         Well Woman GYN exam  P:        Pap smear collected today Encouraged annual mammogram screening Colon cancer screening referral placed today  Labs and immunizations ordered today Discussed breast self exams Encouraged healthy lifestyle  practices To see Jami for weight loss consult  No follow-ups on file.  Earley Favor

## 2023-07-02 LAB — SURESWAB® ADVANCED VAGINITIS PLUS,TMA
C. trachomatis RNA, TMA: NOT DETECTED
CANDIDA SPECIES: NOT DETECTED
Candida glabrata: NOT DETECTED
N. gonorrhoeae RNA, TMA: NOT DETECTED
SURESWAB(R) ADV BACTERIAL VAGINOSIS(BV),TMA: NEGATIVE
TRICHOMONAS VAGINALIS (TV),TMA: NOT DETECTED

## 2023-07-03 LAB — CYTOLOGY - PAP
Adequacy: ABSENT
Comment: NEGATIVE
Diagnosis: NEGATIVE
High risk HPV: NEGATIVE

## 2023-07-04 LAB — HEMOGLOBIN A1C
Hgb A1c MFr Bld: 5.9 %{Hb} — ABNORMAL HIGH (ref <5.7)
Mean Plasma Glucose: 123 mg/dL
eAG (mmol/L): 6.8 mmol/L

## 2023-07-04 LAB — COMPREHENSIVE METABOLIC PANEL
AG Ratio: 1.4 (calc) (ref 1.0–2.5)
ALT: 13 U/L (ref 6–29)
AST: 16 U/L (ref 10–35)
Albumin: 4 g/dL (ref 3.6–5.1)
Alkaline phosphatase (APISO): 73 U/L (ref 31–125)
BUN: 11 mg/dL (ref 7–25)
CO2: 18 mmol/L — ABNORMAL LOW (ref 20–32)
Calcium: 9.3 mg/dL (ref 8.6–10.2)
Chloride: 105 mmol/L (ref 98–110)
Creat: 0.92 mg/dL (ref 0.50–0.99)
Globulin: 2.9 g/dL (ref 1.9–3.7)
Glucose, Bld: 88 mg/dL (ref 65–99)
Potassium: 4.2 mmol/L (ref 3.5–5.3)
Sodium: 136 mmol/L (ref 135–146)
Total Bilirubin: 0.4 mg/dL (ref 0.2–1.2)
Total Protein: 6.9 g/dL (ref 6.1–8.1)

## 2023-07-04 LAB — CBC
HCT: 39.8 % (ref 35.0–45.0)
Hemoglobin: 12.8 g/dL (ref 11.7–15.5)
MCH: 25.5 pg — ABNORMAL LOW (ref 27.0–33.0)
MCHC: 32.2 g/dL (ref 32.0–36.0)
MCV: 79.3 fL — ABNORMAL LOW (ref 80.0–100.0)
MPV: 9.8 fL (ref 7.5–12.5)
Platelets: 396 10*3/uL (ref 140–400)
RBC: 5.02 10*6/uL (ref 3.80–5.10)
RDW: 14.8 % (ref 11.0–15.0)
WBC: 5.9 10*3/uL (ref 3.8–10.8)

## 2023-07-04 LAB — LIPID PANEL
Cholesterol: 204 mg/dL — ABNORMAL HIGH (ref <200)
HDL: 55 mg/dL (ref >=50)
LDL Cholesterol (Calc): 128 mg/dL — ABNORMAL HIGH
Non-HDL Cholesterol (Calc): 149 mg/dL — ABNORMAL HIGH (ref <130)
Total CHOL/HDL Ratio: 3.7 (calc) (ref <5.0)
Triglycerides: 100 mg/dL (ref <150)

## 2023-07-04 LAB — VITAMIN D 1,25 DIHYDROXY
Vitamin D 1, 25 (OH)2 Total: 67 pg/mL (ref 18–72)
Vitamin D2 1, 25 (OH)2: <8 pg/mL
Vitamin D3 1, 25 (OH)2: 67 pg/mL

## 2023-07-04 LAB — TSH: TSH: 1.41 m[IU]/L

## 2023-07-24 ENCOUNTER — Encounter: Payer: Managed Care, Other (non HMO) | Admitting: Radiology

## 2024-06-22 ENCOUNTER — Encounter: Payer: Self-pay | Admitting: Gastroenterology

## 2024-06-22 ENCOUNTER — Other Ambulatory Visit: Payer: Self-pay | Admitting: Obstetrics and Gynecology

## 2024-06-22 DIAGNOSIS — Z1231 Encounter for screening mammogram for malignant neoplasm of breast: Secondary | ICD-10-CM

## 2024-07-04 ENCOUNTER — Ambulatory Visit: Payer: Managed Care, Other (non HMO) | Admitting: Obstetrics and Gynecology

## 2024-07-06 ENCOUNTER — Ambulatory Visit
Admission: RE | Admit: 2024-07-06 | Discharge: 2024-07-06 | Disposition: A | Discharge status: Home / Self Care | Source: Ambulatory Visit | Attending: Obstetrics and Gynecology | Admitting: Obstetrics and Gynecology

## 2024-07-06 DIAGNOSIS — Z1231 Encounter for screening mammogram for malignant neoplasm of breast: Secondary | ICD-10-CM

## 2024-07-11 ENCOUNTER — Other Ambulatory Visit: Payer: Self-pay | Admitting: Obstetrics and Gynecology

## 2024-07-11 ENCOUNTER — Ambulatory Visit: Payer: Self-pay | Admitting: Obstetrics and Gynecology

## 2024-07-11 DIAGNOSIS — R928 Other abnormal and inconclusive findings on diagnostic imaging of breast: Secondary | ICD-10-CM

## 2024-07-20 ENCOUNTER — Ambulatory Visit (INDEPENDENT_AMBULATORY_CARE_PROVIDER_SITE_OTHER): Admitting: Obstetrics and Gynecology

## 2024-07-20 ENCOUNTER — Encounter: Payer: Self-pay | Admitting: Obstetrics and Gynecology

## 2024-07-20 VITALS — BP 118/78 | HR 85 | Ht 59.25 in | Wt 223.0 lb

## 2024-07-20 DIAGNOSIS — Z01419 Encounter for gynecological examination (general) (routine) without abnormal findings: Secondary | ICD-10-CM | POA: Diagnosis not present

## 2024-07-20 DIAGNOSIS — D219 Benign neoplasm of connective and other soft tissue, unspecified: Secondary | ICD-10-CM

## 2024-07-20 DIAGNOSIS — Z1331 Encounter for screening for depression: Secondary | ICD-10-CM | POA: Diagnosis not present

## 2024-07-20 DIAGNOSIS — Z202 Contact with and (suspected) exposure to infections with a predominantly sexual mode of transmission: Secondary | ICD-10-CM

## 2024-07-20 DIAGNOSIS — Z1211 Encounter for screening for malignant neoplasm of colon: Secondary | ICD-10-CM

## 2024-07-20 MED ORDER — TIRZEPATIDE-WEIGHT MANAGEMENT 2.5 MG/0.5ML ~~LOC~~ SOLN
2.5000 mg | SUBCUTANEOUS | 2 refills | Status: AC
Start: 2024-07-20 — End: 2024-08-11

## 2024-07-20 NOTE — Progress Notes (Signed)
 48 y.o. y.o. female here for established annual exam. Patient's last menstrual period was 06/28/2024 (approximate). Period Duration (Days): 5 Period Pattern: Regular Menstrual Flow: Moderate Menstrual Control: Maxi pad Dysmenorrhea: (!) Mild  PAP-07/01/23 no abnormal Mammo scheduled for 07/21/24 Colon: scheduled 11/25 Reports new sexual partner would like sti testing(father of child but are separated) Body mass index is 44.66 kg/m.  Trying diet and exercise with no success. Counseled on wellness clinic. To check benefits for zepbound. [facility] to [facility]. To return for weight check if she starts in one month.     07/20/2024   10:20 AM 06/15/2020    2:15 PM 05/17/2020    2:10 PM  Depression screen PHQ 2/9  Decreased Interest  0 0  Down, Depressed, Hopeless 1 0 0  PHQ - 2 Score 1 0 0  Altered sleeping 3    Tired, decreased energy 2    Change in appetite 0    Feeling bad or failure about yourself  0    Trouble concentrating 0    Moving slowly or fidgety/restless 0    Suicidal thoughts 0    PHQ-9 Score 6    Difficult doing work/chores Not difficult at all      Blood pressure 118/78, pulse 85, height 4' 11.25 (1.505 m), weight 223 lb (101.2 kg), last menstrual period 06/28/2024, SpO2 98%.     Component Value Date/Time   DIAGPAP  07/01/2023 0926    - Negative for intraepithelial lesion or malignancy (NILM)   DIAGPAP  05/23/2021 0853    - Negative for intraepithelial lesion or malignancy (NILM)   DIAGPAP  05/05/2018 0000    NEGATIVE FOR INTRAEPITHELIAL LESIONS OR MALIGNANCY.   DIAGPAP  05/05/2018 0000    FUNGAL ORGANISMS PRESENT CONSISTENT WITH CANDIDA SPP.   HPVHIGH Negative 07/01/2023 0926   HPVHIGH Negative 05/23/2021 0853   ADEQPAP  07/01/2023 0926    Satisfactory for evaluation; transformation zone component ABSENT.   ADEQPAP  05/23/2021 0853    Satisfactory for evaluation; transformation zone component PRESENT.   ADEQPAP  05/05/2018 0000     Satisfactory for evaluation  endocervical/transformation zone component PRESENT.    GYN HISTORY:    Component Value Date/Time   DIAGPAP  07/01/2023 0926    - Negative for intraepithelial lesion or malignancy (NILM)   DIAGPAP  05/23/2021 0853    - Negative for intraepithelial lesion or malignancy (NILM)   DIAGPAP  05/05/2018 0000    NEGATIVE FOR INTRAEPITHELIAL LESIONS OR MALIGNANCY.   DIAGPAP  05/05/2018 0000    FUNGAL ORGANISMS PRESENT CONSISTENT WITH CANDIDA SPP.   HPVHIGH Negative 07/01/2023 0926   HPVHIGH Negative 05/23/2021 0853   ADEQPAP  07/01/2023 0926    Satisfactory for evaluation; transformation zone component ABSENT.   ADEQPAP  05/23/2021 0853    Satisfactory for evaluation; transformation zone component PRESENT.   ADEQPAP  05/05/2018 0000    Satisfactory for evaluation  endocervical/transformation zone component PRESENT.    OB History  Gravida Para Term Preterm AB Living  7 1 1  0 6 1  SAB IAB Ectopic Multiple Live Births  0 6 0 0 1    # Outcome Date GA Lbr Len/2nd Weight Sex Type Anes PTL Lv  7 Term 03/16/10 [redacted]w[redacted]d  9 lb 5 oz (4.224 kg) F CS-Unspec   LIV  6 IAB           5 IAB           4 IAB  3 IAB           2 IAB           1 IAB             Past Medical History:  Diagnosis Date   Morbid obesity ([facility]) 03/30/2020   Prediabetes 05/15/2020   a1c 5.30 Apr 2020 Dr. Jannis    Past Surgical History:  Procedure Laterality Date   CESAREAN SECTION  2011   x 1    Current Outpatient Medications on File Prior to Visit  Medication Sig Dispense Refill   Cyanocobalamin (VITAMIN B-12 CR PO) Take by mouth.     VITAMIN D  PO Take by mouth.     No current facility-administered medications on file prior to visit.    Social History   Socioeconomic History   Marital status: Single    Spouse name: Not on file   Number of children: 1   Years of education: Not on file   Highest education level: Not on file  Occupational History   Occupation: Runner, Broadcasting/film/video: [employer]  Tobacco Use   Smoking status: Never   Smokeless tobacco: Never  Vaping Use   Vaping status: Never Used  Substance and Sexual Activity   Alcohol use: No   Drug use: No   Sexual activity: Yes    Partners: Male    Birth control/protection: None  Other Topics Concern   Not on file  Social History Narrative   Not on file   Social Drivers of Health   Financial Resource Strain: Not on file  Food Insecurity: Not on file  Transportation Needs: Not on file  Physical Activity: Not on file  Stress: Not on file  Social Connections: Not on file  Intimate Partner Violence: Not on file    Family History  Problem Relation Age of Onset   Hypertension Mother    Diabetes Mother    Diabetes Father    Hypertension Father    Asthma Brother    Breast cancer Paternal Aunt    Breast cancer Maternal Grandmother    Breast cancer Paternal Grandmother      Allergies  Allergen Reactions   Penicillins       Patient's last menstrual period was Patient's last menstrual period was 06/28/2024 (approximate)..            Review of Systems Alls systems reviewed and are negative.     Physical Exam Constitutional:      Appearance: Normal appearance.  Genitourinary:     Vulva and urethral meatus normal.     No lesions in the vagina.     Genitourinary Comments: Possible fibroids     Right Labia: No rash, lesions or skin changes.    Left Labia: No lesions, skin changes or rash.    No vaginal discharge or tenderness.     No vaginal prolapse present.    No vaginal atrophy present.     Right Adnexa: not tender, not palpable and no mass present.    Left Adnexa: not tender, not palpable and no mass present.    No cervical motion tenderness or discharge.     Uterus is irregular.     Uterus is not enlarged or tender.  Breasts:    Right: Normal.     Left: Normal.  HENT:     Head: Normocephalic.  Neck:     Thyroid: No thyroid mass, thyromegaly or  thyroid tenderness.  Cardiovascular:  Rate and Rhythm: Normal rate and regular rhythm.     Heart sounds: Normal heart sounds, S1 normal and S2 normal.  Pulmonary:     Effort: Pulmonary effort is normal.     Breath sounds: Normal breath sounds and air entry.  Abdominal:     General: There is no distension.     Palpations: Abdomen is soft. There is no mass.     Tenderness: There is no abdominal tenderness. There is no guarding or rebound.  Musculoskeletal:        General: Normal range of motion.     Cervical back: Full passive range of motion without pain, normal range of motion and neck supple. No tenderness.     Right lower leg: No edema.     Left lower leg: No edema.  Neurological:     Mental Status: She is alert.  Skin:    General: Skin is warm.  Psychiatric:        Mood and Affect: Mood normal.        Behavior: Behavior normal.        Thought Content: Thought content normal.  Vitals and nursing note reviewed. Exam conducted with a chaperone present.       A:         Well Woman GYN exam Possible fibroids on exam Sti testing                              P:        Pap smear not indicated Encouraged annual mammogram screening Colon cancer screening referral placed today DXA not indicated Labs and immunizations ordered today Discussed breast self exams Encouraged healthy lifestyle practices Encouraged Vit D and Calcium  Counseled on weight loss with diet and exercise. Referral placed to wellness clinic. Considering zepbound. Self pay instructions discussed as well.  No follow-ups on file.  Almarie MARLA Carpen

## 2024-07-21 ENCOUNTER — Ambulatory Visit
Admission: RE | Admit: 2024-07-21 | Discharge: 2024-07-21 | Disposition: A | Source: Ambulatory Visit | Attending: Obstetrics and Gynecology

## 2024-07-21 ENCOUNTER — Ambulatory Visit: Payer: Self-pay | Admitting: Obstetrics and Gynecology

## 2024-07-21 ENCOUNTER — Other Ambulatory Visit: Payer: Self-pay | Admitting: Obstetrics and Gynecology

## 2024-07-21 DIAGNOSIS — D500 Iron deficiency anemia secondary to blood loss (chronic): Secondary | ICD-10-CM

## 2024-07-21 DIAGNOSIS — R928 Other abnormal and inconclusive findings on diagnostic imaging of breast: Secondary | ICD-10-CM

## 2024-07-21 LAB — SURESWAB® ADVANCED VAGINITIS PLUS,TMA
C. trachomatis RNA, TMA: NOT DETECTED
CANDIDA SPECIES: DETECTED — AB
Candida glabrata: NOT DETECTED
N. gonorrhoeae RNA, TMA: NOT DETECTED
SURESWAB(R) ADV BACTERIAL VAGINOSIS(BV),TMA: POSITIVE — AB
TRICHOMONAS VAGINALIS (TV),TMA: NOT DETECTED

## 2024-07-21 LAB — CBC
HCT: 36.7 % (ref 35.0–45.0)
Hemoglobin: 11.3 g/dL — ABNORMAL LOW (ref 11.7–15.5)
MCH: 24 pg — ABNORMAL LOW (ref 27.0–33.0)
MCHC: 30.8 g/dL — ABNORMAL LOW (ref 32.0–36.0)
MCV: 77.9 fL — ABNORMAL LOW (ref 80.0–100.0)
MPV: 9.2 fL (ref 7.5–12.5)
Platelets: 469 Thousand/uL — ABNORMAL HIGH (ref 140–400)
RBC: 4.71 Million/uL (ref 3.80–5.10)
RDW: 15.2 % — ABNORMAL HIGH (ref 11.0–15.0)
WBC: 6.8 Thousand/uL (ref 3.8–10.8)

## 2024-07-21 LAB — TSH: TSH: 1.01 m[IU]/L

## 2024-07-21 LAB — HIV ANTIBODY (ROUTINE TESTING W REFLEX)
HIV 1&2 Ab, 4th Generation: NONREACTIVE
HIV FINAL INTERPRETATION: NEGATIVE

## 2024-07-21 LAB — LIPID PANEL
Cholesterol: 199 mg/dL (ref <200)
HDL: 57 mg/dL (ref >=50)
LDL Cholesterol (Calc): 123 mg/dL — ABNORMAL HIGH
Non-HDL Cholesterol (Calc): 142 mg/dL — ABNORMAL HIGH (ref <130)
Total CHOL/HDL Ratio: 3.5 (calc) (ref <5.0)
Triglycerides: 89 mg/dL (ref <150)

## 2024-07-21 LAB — COMPREHENSIVE METABOLIC PANEL WITH GFR
AG Ratio: 1.5 (calc) (ref 1.0–2.5)
ALT: 21 U/L (ref 6–29)
AST: 19 U/L (ref 10–35)
Albumin: 4.3 g/dL (ref 3.6–5.1)
Alkaline phosphatase (APISO): 70 U/L (ref 31–125)
BUN: 10 mg/dL (ref 7–25)
CO2: 24 mmol/L (ref 20–32)
Calcium: 9.6 mg/dL (ref 8.6–10.2)
Chloride: 107 mmol/L (ref 98–110)
Creat: 0.76 mg/dL (ref 0.50–0.99)
Globulin: 2.8 g/dL (ref 1.9–3.7)
Glucose, Bld: 82 mg/dL (ref 65–99)
Potassium: 4.2 mmol/L (ref 3.5–5.3)
Sodium: 141 mmol/L (ref 135–146)
Total Bilirubin: 0.2 mg/dL (ref 0.2–1.2)
Total Protein: 7.1 g/dL (ref 6.1–8.1)
eGFR: 98 mL/min/1.73m2 (ref >=60)

## 2024-07-21 LAB — HEMOGLOBIN A1C
Hgb A1c MFr Bld: 5.9 % — ABNORMAL HIGH (ref <5.7)
Mean Plasma Glucose: 123 mg/dL
eAG (mmol/L): 6.8 mmol/L

## 2024-07-21 LAB — HEPATITIS B SURFACE ANTIGEN: Hepatitis B Surface Ag: NONREACTIVE

## 2024-07-21 LAB — VITAMIN D 25 HYDROXY (VIT D DEFICIENCY, FRACTURES): Vit D, 25-Hydroxy: 31 ng/mL (ref 30–100)

## 2024-07-21 LAB — RPR: RPR Ser Ql: NONREACTIVE

## 2024-07-21 LAB — HEPATITIS C ANTIBODY: Hepatitis C Ab: NONREACTIVE

## 2024-07-22 ENCOUNTER — Other Ambulatory Visit: Payer: Self-pay | Admitting: Obstetrics and Gynecology

## 2024-07-22 MED ORDER — METRONIDAZOLE 500 MG PO TABS: 500.0000 mg | ORAL_TABLET | Freq: Two times a day (BID) | ORAL | 0 refills | Status: DC

## 2024-07-22 MED ORDER — FLUCONAZOLE 150 MG PO TABS: 150.0000 mg | ORAL_TABLET | Freq: Once | ORAL | 0 refills | Status: AC

## 2024-07-29 ENCOUNTER — Ambulatory Visit (AMBULATORY_SURGERY_CENTER)

## 2024-07-29 VITALS — Ht 59.0 in | Wt 223.0 lb

## 2024-07-29 DIAGNOSIS — Z1211 Encounter for screening for malignant neoplasm of colon: Secondary | ICD-10-CM

## 2024-07-29 MED ORDER — NA SULFATE-K SULFATE-MG SULF 17.5-3.13-1.6 GM/177ML PO SOLN: 1.0000 | Freq: Once | ORAL | 0 refills | Status: AC

## 2024-07-29 NOTE — Progress Notes (Signed)

## 2024-08-09 ENCOUNTER — Encounter: Payer: Self-pay | Admitting: Gastroenterology

## 2024-08-12 ENCOUNTER — Ambulatory Visit: Admitting: Gastroenterology

## 2024-08-12 ENCOUNTER — Encounter: Payer: Self-pay | Admitting: Gastroenterology

## 2024-08-12 VITALS — BP 135/79 | HR 75 | Temp 97.6°F | Resp 18 | Ht 59.0 in | Wt 223.0 lb

## 2024-08-12 DIAGNOSIS — K635 Polyp of colon: Secondary | ICD-10-CM | POA: Diagnosis not present

## 2024-08-12 DIAGNOSIS — Z1211 Encounter for screening for malignant neoplasm of colon: Secondary | ICD-10-CM

## 2024-08-12 DIAGNOSIS — D120 Benign neoplasm of cecum: Secondary | ICD-10-CM

## 2024-08-12 MED ORDER — SODIUM CHLORIDE 0.9 % IV SOLN: 500.0000 mL | Freq: Once | INTRAVENOUS | Status: AC

## 2024-08-12 NOTE — Op Note (Signed)
 [facility] Patient Name: Sabrina Holmes Procedure Date: 08/12/2024 10:28 AM MRN: [id number] Endoscopist: Glendia E. Stacia , MD, [id number] Age: 48 Referring MD:  Date of Birth: 06/20/1976 Gender: Female Account #: [id number] Procedure:                Colonoscopy Indications:              Screening for colorectal malignant neoplasm, This                            is the patient's first colonoscopy Medicines:                Monitored Anesthesia Care Procedure:                Pre-Anesthesia Assessment:                           - Prior to the procedure, a History and Physical                            was performed, and patient medications and                            allergies were reviewed. The patient's tolerance of                            previous anesthesia was also reviewed. The risks                            and benefits of the procedure and the sedation                            options and risks were discussed with the patient.                            All questions were answered, and informed consent                            was obtained. Prior Anticoagulants: The patient has                            taken no anticoagulant or antiplatelet agents. ASA                            Grade Assessment: II - A patient with mild systemic                            disease. After reviewing the risks and benefits,                            the patient was deemed in satisfactory condition to                            undergo the procedure.  After obtaining informed consent, the colonoscope                            was passed under direct vision. Throughout the                            procedure, the patient's blood pressure, pulse, and                            oxygen saturations were monitored continuously. The                            CF [id number] #[id number] was introduced through the anus                            and  advanced to the the cecum, identified by                            appendiceal orifice and ileocecal valve. The                            colonoscopy was performed without difficulty. The                            patient tolerated the procedure well. The quality                            of the bowel preparation was good. The ileocecal                            valve, appendiceal orifice, and rectum were                            photographed. The bowel preparation used was SUPREP                            via split dose instruction. Scope In: 10:47:34 AM Scope Out: 11:00:34 AM Scope Withdrawal Time: 0 hours 9 minutes 29 seconds  Total Procedure Duration: 0 hours 13 minutes 0 seconds  Findings:                 The perianal and digital rectal examinations were                            normal. Pertinent negatives include normal                            sphincter tone and no palpable rectal lesions.                           A 2 mm polyp was found in the cecum. The polyp was                            sessile. The polyp was removed with  a cold snare.                            Resection and retrieval were complete. Estimated                            blood loss was minimal.                           The exam was otherwise normal throughout the                            examined colon.                           The retroflexed view of the distal rectum and anal                            verge was normal and showed no anal or rectal                            abnormalities. Complications:            No immediate complications. Estimated Blood Loss:     Estimated blood loss was minimal. Impression:               - One 2 mm polyp in the cecum, removed with a cold                            snare. Resected and retrieved.                           - The distal rectum and anal verge are normal on                            retroflexion view. Recommendation:           - Patient has  a contact number available for                            emergencies. The signs and symptoms of potential                            delayed complications were discussed with the                            patient. Return to normal activities tomorrow.                            Written discharge instructions were provided to the                            patient.                           - Resume previous diet.                           -  Continue present medications.                           - Await pathology results.                           - Repeat colonoscopy (date not yet determined) for                            surveillance based on pathology results. Brevan Luberto E. Stacia, MD 08/12/2024 11:08:24 AM This report has been signed electronically.

## 2024-08-12 NOTE — Progress Notes (Signed)
 Sedate, gd SR, tolerated procedure well, VSS, report to RN

## 2024-08-12 NOTE — Progress Notes (Signed)
 Pt's states no medical or surgical changes since previsit or office visit.

## 2024-08-12 NOTE — Patient Instructions (Signed)
 YOU HAD AN ENDOSCOPIC PROCEDURE TODAY AT THE [facility]:   Refer to the procedure report that was given to you for any specific questions about what was found during the examination.  If the procedure report does not answer your questions, please call your gastroenterologist to clarify.  If you requested that your care partner not be given the details of your procedure findings, then the procedure report has been included in a sealed envelope for you to review at your convenience later.  YOU SHOULD EXPECT: Some feelings of bloating in the abdomen. Passage of more gas than usual.  Walking can help get rid of the air that was put into your GI tract during the procedure and reduce the bloating. If you had a lower endoscopy (such as a colonoscopy or flexible sigmoidoscopy) you may notice spotting of blood in your stool or on the toilet paper. If you underwent a bowel prep for your procedure, you may not have a normal bowel movement for a few days.  Please Note:  You might notice some irritation and congestion in your nose or some drainage.  This is from the oxygen used during your procedure.  There is no need for concern and it should clear up in a day or so.  SYMPTOMS TO REPORT IMMEDIATELY:  Following lower endoscopy (colonoscopy or flexible sigmoidoscopy):  Excessive amounts of blood in the stool  Significant tenderness or worsening of abdominal pains  Swelling of the abdomen that is new, acute  Fever of 100F or higher   For urgent or emergent issues, a gastroenterologist can be reached at any hour by calling [phone]. Do not use MyChart messaging for urgent concerns.    DIET:  We do recommend a small meal at first, but then you may proceed to your regular diet.  Drink plenty of fluids but you should avoid alcoholic beverages for 24 hours.  MEDICATIONS: Continue present medications.  FOLLOW UP: Await pathology results. Repeat colonoscopy (date not yet determined) for  surveillance based on pathology results.  Thank you for allowing us  to provide for your healthcare needs today.  ACTIVITY:  You should plan to take it easy for the rest of today and you should NOT DRIVE or use heavy machinery until tomorrow (because of the sedation medicines used during the test).    FOLLOW UP: Our staff will call the number listed on your records the next business day following your procedure.  We will call around 7:15- 8:00 am to check on you and address any questions or concerns that you may have regarding the information given to you following your procedure. If we do not reach you, we will leave a message.     If any biopsies were taken you will be contacted by phone or by letter within the next 1-3 weeks.  Please call us  at [phone] if you have not heard about the biopsies in 3 weeks.    SIGNATURES/CONFIDENTIALITY: You and/or your care partner have signed paperwork which will be entered into your electronic medical record.  These signatures attest to the fact that that the information above on your After Visit Summary has been reviewed and is understood.  Full responsibility of the confidentiality of this discharge information lies with you and/or your care-partner.

## 2024-08-12 NOTE — Progress Notes (Signed)
 Called to room to assist during endoscopic procedure.  Patient ID and intended procedure confirmed with present staff. Received instructions for my participation in the procedure from the performing physician.

## 2024-08-12 NOTE — Progress Notes (Signed)
 [facility] History and Physical   Primary Care Physician:  Jodie Lavern CROME, MD   Reason for Procedure:   Colon cancer screening  Plan:    Screening colonoscopy   HPI: Sabrina Holmes is a 48 y.o. female undergoing initial average risk screening colonoscopy.  She has no family history of colon cancer and no chronic GI symptoms.    The patient was provided an opportunity to ask questions and all were answered. The patient agreed with the plan   Past Medical History:  Diagnosis Date   Anemia    Morbid obesity (HCC) 03/30/2020   Prediabetes 05/15/2020   a1c 5.30 Apr 2020 Dr. Jannis    Past Surgical History:  Procedure Laterality Date   CESAREAN SECTION  2011   x 1    Prior to Admission medications   Medication Sig Start Date End Date Taking? Authorizing Provider  Cyanocobalamin (VITAMIN B-12 CR PO) Take by mouth.    [provider]  VITAMIN D  PO Take by mouth.    [provider]    Current Outpatient Medications  Medication Sig Dispense Refill   Cyanocobalamin (VITAMIN B-12 CR PO) Take by mouth.     VITAMIN D  PO Take by mouth.     Current Facility-Administered Medications  Medication Dose Route Frequency Provider Last Rate Last Admin   0.9 %  sodium chloride  infusion  500 mL Intravenous Once Stacia Glendia BRAVO, MD        Allergies as of 08/12/2024 - Review Complete 08/12/2024  Allergen Reaction Noted   Penicillins Other (See Comments) 11/02/2013    Family History  Problem Relation Age of Onset   Hypertension Mother    Diabetes Mother    Diabetes Father    Hypertension Father    Asthma Brother    Breast cancer Paternal Aunt    Breast cancer Maternal Grandmother    Breast cancer Paternal Grandmother    Colon cancer Neg Hx    Colon polyps Neg Hx    Esophageal cancer Neg Hx    Rectal cancer Neg Hx    Stomach cancer Neg Hx     Social History   Socioeconomic History   Marital status: Single    Spouse name: Not on file    Number of children: 1   Years of education: Not on file   Highest education level: Not on file  Occupational History   Occupation: Multimedia Programmer: [employer]  Tobacco Use   Smoking status: Never   Smokeless tobacco: Never  Vaping Use   Vaping status: Never Used  Substance and Sexual Activity   Alcohol use: No   Drug use: No   Sexual activity: Yes    Partners: Male    Birth control/protection: None  Other Topics Concern   Not on file  Social History Narrative   Not on file   Social Drivers of Health   Financial Resource Strain: Not on file  Food Insecurity: Not on file  Transportation Needs: Not on file  Physical Activity: Not on file  Stress: Not on file  Social Connections: Not on file  Intimate Partner Violence: Not on file    Review of Systems:  All other review of systems negative except as mentioned in the HPI.  Physical Exam: Vital signs BP (!) 106/47   Pulse 82   Temp 97.6 F (36.4 C) (Temporal)   Ht 4' 11 (1.499 m)   Wt 223 lb (101.2 kg)   LMP  07/27/2024 (Approximate)   SpO2 100%   BMI 45.04 kg/m   General:   Alert,  Well-developed, well-nourished, pleasant and cooperative in NAD Airway:  Mallampati 2 Lungs:  Clear throughout to auscultation.   Heart:  Regular rate and rhythm; no murmurs, clicks, rubs,  or gallops. Abdomen:  Soft, nontender and nondistended. Normal bowel sounds.   Neuro/Psych:  Normal mood and affect. A and O x 3   Braileigh Landenberger E. Stacia, MD [facility]

## 2024-08-15 ENCOUNTER — Telehealth: Payer: Self-pay

## 2024-08-15 NOTE — Telephone Encounter (Signed)
  Follow up Call-     08/12/2024    9:45 AM  Call back number  Post procedure Call Back phone  # [phone]  Permission to leave phone message Yes     Patient questions:  Do you have a fever, pain , or abdominal swelling? No. Pain Score  0 *  Have you tolerated food without any problems? Yes.    Have you been able to return to your normal activities? Yes.    Do you have any questions about your discharge instructions: Diet   No. Medications  No. Follow up visit  No.  Do you have questions or concerns about your Care? No.  Actions: * If pain score is 4 or above: No action needed, pain <4.

## 2024-08-17 LAB — SURGICAL PATHOLOGY

## 2024-08-23 ENCOUNTER — Ambulatory Visit: Payer: Self-pay | Admitting: Gastroenterology

## 2024-08-23 NOTE — Progress Notes (Signed)
 Sabrina Holmes,  Good news: the polyp that I removed during your recent examination was NOT precancerous.  You should continue to follow current colorectal cancer screening guidelines with a repeat colonoscopy in 10 years.    If you develop any new rectal bleeding, abdominal pain or significant bowel habit changes, please contact me before then.

## 2024-08-25 ENCOUNTER — Other Ambulatory Visit: Admitting: Obstetrics and Gynecology

## 2024-08-25 ENCOUNTER — Other Ambulatory Visit

## 2024-09-26 ENCOUNTER — Telehealth: Payer: Self-pay | Admitting: Oncology

## 2024-09-27 ENCOUNTER — Inpatient Hospital Stay: Admitting: Oncology

## 2024-09-27 ENCOUNTER — Inpatient Hospital Stay

## 2024-10-11 ENCOUNTER — Ambulatory Visit (INDEPENDENT_AMBULATORY_CARE_PROVIDER_SITE_OTHER): Admitting: Obstetrics and Gynecology

## 2024-10-11 ENCOUNTER — Ambulatory Visit (INDEPENDENT_AMBULATORY_CARE_PROVIDER_SITE_OTHER)

## 2024-10-11 VITALS — BP 126/82 | HR 74 | Ht 59.0 in | Wt 222.0 lb

## 2024-10-11 DIAGNOSIS — D500 Iron deficiency anemia secondary to blood loss (chronic): Secondary | ICD-10-CM

## 2024-10-11 DIAGNOSIS — N920 Excessive and frequent menstruation with regular cycle: Secondary | ICD-10-CM

## 2024-10-11 DIAGNOSIS — D219 Benign neoplasm of connective and other soft tissue, unspecified: Secondary | ICD-10-CM | POA: Diagnosis not present

## 2024-10-11 DIAGNOSIS — Z712 Person consulting for explanation of examination or test findings: Secondary | ICD-10-CM | POA: Diagnosis not present

## 2024-10-11 DIAGNOSIS — Z01419 Encounter for gynecological examination (general) (routine) without abnormal findings: Secondary | ICD-10-CM

## 2024-10-11 MED ORDER — LEVONORGESTREL 20 MCG/DAY IU IUD: 1.0000 | INTRAUTERINE_SYSTEM | Freq: Once | INTRAUTERINE | Status: AC

## 2024-10-11 NOTE — Progress Notes (Signed)
" ° °  Acute Office Visit  Subjective:    Patient ID: Sabrina Holmes, female    DOB: 1976/02/12, 49 y.o.   MRN: [id number]   HPI 49 y.o. presents today for No chief complaint on file. .hg 11.3 Seeing hematology on 2/3 for iv iron consult Iron studies today  Reports period q month Using 2 pads at a time, so she does not leak through.  Changing pads about 4 times a day Bleeds for 4-5 days a month. Lighter bleeding noted by day 3 Has tried IUD in the past and noted an odor and removed it  Patient's last menstrual period was 09/21/2024 (exact date). Period Duration (Days): 4 Period Pattern: Regular Menstrual Flow: Moderate Menstrual Control: Maxi pad Menstrual Control Change Freq (Hours): 3 Dysmenorrhea: (!) Mild Dysmenorrhea Symptoms: Cramping  Review of Systems     Objective:    OBGyn Exam  BP 126/82 (BP Location: Left Arm, Patient Position: Sitting, Cuff Size: Normal)   Pulse 74   Ht 4' 11 (1.499 m)   Wt 222 lb (100.7 kg)   LMP 09/21/2024 (Exact Date)   SpO2 98%   BMI 44.84 kg/m  Wt Readings from Last 3 Encounters:  10/11/24 222 lb (100.7 kg)  08/12/24 223 lb (101.2 kg)  07/29/24 223 lb (101.2 kg)       PUS resuts today 11.34cm uterus Endometrial lining 10.14mm She is going to have a cycle in 9 days Left ovarian ovulation follicle measuring 1.6cm 0.81cm fibroid  Overall report with Normal uterus with size and shape Subcentimeter intramural fibroid noted Symmetrical endometrium No masses or thickening seen Normal ovaries No adnexal masses No free fluid  Assessment & Plan:  Menorrhagia Fibroid anemia  Discussed all options with r/b/a/I. To place mirena  IUD. Discussed benefit to protect lining from endometrial cancer and reduce her blood loss by 80% in the first year to ultimately improve anemia. R/b/a/I of mirena  discussed. Iron studies today. To complete hematology consult. 30 minutes spent on reviewing records, imaging,  and one on one patient time  and counseling patient and documentation Dr. Glennon  Counseled on PUS results today.  Dr. Glennon Sabrina Holmes "

## 2024-10-12 ENCOUNTER — Other Ambulatory Visit: Payer: Self-pay | Admitting: Obstetrics and Gynecology

## 2024-10-12 ENCOUNTER — Ambulatory Visit: Payer: Self-pay | Admitting: Obstetrics and Gynecology

## 2024-10-12 DIAGNOSIS — D500 Iron deficiency anemia secondary to blood loss (chronic): Secondary | ICD-10-CM

## 2024-10-12 LAB — IRON,TIBC AND FERRITIN PANEL
%SAT: 6 % — ABNORMAL LOW (ref 16–45)
Ferritin: 6 ng/mL — ABNORMAL LOW (ref 16–232)
Iron: 27 ug/dL — ABNORMAL LOW (ref 40–190)
TIBC: 430 ug/dL (ref 250–450)

## 2024-10-20 NOTE — Progress Notes (Signed)
 [facility] CONSULT NOTE  Patient Care Team: Jodie Lavern CROME, MD as PCP - General (Family Medicine)  ASSESSMENT & PLAN:  Sabrina Holmes is a 49 y.o. female with history of heavy menstrual cycle, fibroid being seen for iron deficiency anemia.  Relevant history: History of heavy menstrual bleeding Last colonoscopy: 07/2024  The mechanism of IDA is due to either blood loss or decreased absorptive mechanism or both.  We discussed options for iron deficiency.  She desired to receive IV iron.  Some of the risks, benefits, and alternatives of intravenous iron infusions.  Some of the side-effects to be expected including risks of infusion reactions, phlebitis, headaches, nausea and fatigue.  The patient is willing to proceed.   Ordering iv iron to be given at [address]  Assessment & Plan Iron deficiency anemia due to chronic blood loss IV iron Venofer 200 mg x 5 ordered. CBC, Iron, TIBC, ferritin in June Follow-up few days after visit.  Orders Placed This Encounter  Procedures   CBC with Differential (Cancer Center Only)    Standing Status:   Future    Expiration Date:   10/21/2025   Iron and Iron Binding Capacity ([facility] only)    Standing Status:   Future    Expiration Date:   10/21/2025   Ferritin    Standing Status:   Future    Expiration Date:   10/21/2025      All questions were answered. The patient knows to call the clinic with any problems, questions or concerns.  Sabrina JAYSON Chihuahua, MD 1/30/202611:51 AM   CHIEF COMPLAINTS/PURPOSE OF CONSULTATION:  Anemia  HISTORY OF PRESENTING ILLNESS:  Sabrina Holmes 49 y.o. female is here because of anemia.  On 10/11/2024 ferritin was 6 and iron saturation 6%.  Hemoglobin was 11 and MCV was 79 in October 2025.  Reports she bleeds once a month. First day is light and heavier on 2nd and 3rd days changing 3-4x a days. She uses 2 pads together to prevent going through. she will be placed on Mirena  next week. She has  fibroid.  Keriann had not noticed any recent bleeding such as melena, hematuria or hematochezia. Colonoscopy found one polyps. No stomach pain or heartburn. No NSAIDs daily.   She was told to get iron pill OTC but she has not.   MEDICAL HISTORY:  Past Medical History:  Diagnosis Date   Anemia    Morbid obesity (HCC) 03/30/2020   Prediabetes 05/15/2020   a1c 5.30 Apr 2020 Dr. Jannis    SURGICAL HISTORY: Past Surgical History:  Procedure Laterality Date   CESAREAN SECTION  2011   x 1    SOCIAL HISTORY: Social History   Socioeconomic History   Marital status: Single    Spouse name: Not on file   Number of children: 1   Years of education: Not on file   Highest education level: Not on file  Occupational History   Occupation: Multimedia Programmer: [employer]  Tobacco Use   Smoking status: Never   Smokeless tobacco: Never  Vaping Use   Vaping status: Never Used  Substance and Sexual Activity   Alcohol use: No   Drug use: No   Sexual activity: Yes    Partners: Male    Birth control/protection: None  Other Topics Concern   Not on file  Social History Narrative   Not on file   Social Drivers of Health   Tobacco Use: Low Risk (08/12/2024)  Patient History    Smoking Tobacco Use: Never    Smokeless Tobacco Use: Never    Passive Exposure: Not on file  Financial Resource Strain: Not on file  Food Insecurity: Food Insecurity Present (10/21/2024)   Epic    Worried About Programme Researcher, Broadcasting/film/video in the Last Year: Sometimes true    Ran Out of Food in the Last Year: Sometimes true  Transportation Needs: No Transportation Needs (10/21/2024)   Epic    Lack of Transportation (Medical): No    Lack of Transportation (Non-Medical): No  Physical Activity: Not on file  Stress: Not on file  Social Connections: Not on file  Intimate Partner Violence: Not At Risk (10/21/2024)   Epic    Fear of Current or Ex-Partner: No    Emotionally Abused: No    Physically  Abused: No    Sexually Abused: No  Depression (PHQ2-9): Low Risk (10/21/2024)   Depression (PHQ2-9)    PHQ-2 Score: 0  Alcohol Screen: Not on file  Housing: Unknown (10/21/2024)   Epic    Unable to Pay for Housing in the Last Year: No    Number of Times Moved in the Last Year: Not on file    Homeless in the Last Year: No  Utilities: Not At Risk (10/21/2024)   Epic    Threatened with loss of utilities: No  Health Literacy: Not on file    FAMILY HISTORY: Family History  Problem Relation Age of Onset   Hypertension Mother    Diabetes Mother    Diabetes Father    Hypertension Father    Asthma Brother    Breast cancer Paternal Aunt    Breast cancer Maternal Grandmother    Breast cancer Paternal Grandmother    Colon cancer Neg Hx    Colon polyps Neg Hx    Esophageal cancer Neg Hx    Rectal cancer Neg Hx    Stomach cancer Neg Hx     ALLERGIES:  is allergic to penicillins.  MEDICATIONS:  Current Outpatient Medications  Medication Sig Dispense Refill   Cyanocobalamin (VITAMIN B-12 CR PO) Take by mouth.     VITAMIN D  PO Take by mouth.     Current Facility-Administered Medications  Medication Dose Route Frequency Provider Last Rate Last Admin   0.9 %  sodium chloride  infusion  500 mL Intravenous Once Cunningham, Scott E, MD       levonorgestrel  (MIRENA ) 20 MCG/DAY IUD 1 each  1 each Intrauterine Once         REVIEW OF SYSTEMS:   All relevant systems were reviewed with the patient and are negative.  PHYSICAL EXAMINATION:  Vitals:   10/21/24 1106  BP: 126/68  Pulse: 84  Resp: 18  Temp: 98.1 F (36.7 C)  SpO2: 100%   Filed Weights   10/21/24 1106  Weight: 224 lb (101.6 kg)    GENERAL: alert, no distress and comfortable SKIN: skin color normal EYES: normal conjunctiva, sclera clear LUNGS: normal breathing effort HEART: regular rate & rhythm ABDOMEN: abdomen soft, non-tender and nondistended  RADIOGRAPHIC STUDIES: I have personally reviewed the radiological  images as listed and agreed with the findings in the report. US  PELVIC COMPLETE WITH TRANSVAGINAL Result Date: 10/11/2024 11.34cm uterus Endometrial lining 10.64mm She is going to have a cycle in 9 days Left ovarian ovulation follicle measuring 1.6cm 0.81cm fibroid Overall report with Normal uterus with size and shape Subcentimeter intramural fibroid noted Symmetrical endometrium No masses or thickening seen Normal ovaries No adnexal  masses No free fluid

## 2024-10-21 ENCOUNTER — Other Ambulatory Visit (HOSPITAL_COMMUNITY): Payer: Self-pay

## 2024-10-21 ENCOUNTER — Inpatient Hospital Stay

## 2024-10-21 VITALS — BP 126/68 | HR 84 | Temp 98.1°F | Resp 18 | Ht 59.0 in | Wt 224.0 lb

## 2024-10-21 DIAGNOSIS — D500 Iron deficiency anemia secondary to blood loss (chronic): Secondary | ICD-10-CM

## 2024-10-21 DIAGNOSIS — Z803 Family history of malignant neoplasm of breast: Secondary | ICD-10-CM | POA: Diagnosis not present

## 2024-10-21 DIAGNOSIS — D509 Iron deficiency anemia, unspecified: Secondary | ICD-10-CM | POA: Diagnosis present

## 2024-10-21 NOTE — Assessment & Plan Note (Addendum)
 IV iron Venofer 200 mg x 5 ordered. CBC, Iron, TIBC, ferritin in June Follow-up few days after visit.

## 2024-10-25 ENCOUNTER — Inpatient Hospital Stay: Admitting: Oncology

## 2024-10-25 ENCOUNTER — Inpatient Hospital Stay

## 2024-10-26 ENCOUNTER — Ambulatory Visit: Admitting: Obstetrics and Gynecology

## 2024-10-31 ENCOUNTER — Ambulatory Visit

## 2024-11-02 ENCOUNTER — Ambulatory Visit

## 2024-11-07 ENCOUNTER — Ambulatory Visit

## 2024-11-09 ENCOUNTER — Ambulatory Visit

## 2024-11-11 ENCOUNTER — Ambulatory Visit

## 2024-11-11 ENCOUNTER — Ambulatory Visit: Admitting: Obstetrics and Gynecology

## 2025-02-20 ENCOUNTER — Inpatient Hospital Stay

## 2025-02-21 ENCOUNTER — Inpatient Hospital Stay
